# Patient Record
Sex: Female | Born: 1990 | Hispanic: Yes | Smoking: Never smoker
Health system: Southern US, Community
[De-identification: ages and names within clinical notes are randomized; demographics above are authoritative.]

## PROBLEM LIST (undated history)

## (undated) HISTORY — PX: NO PAST SURGERIES: SHX2092

---

## 2011-09-09 ENCOUNTER — Inpatient Hospital Stay (HOSPITAL_COMMUNITY): Admission: AD | Admit: 2011-09-09 | Payer: Self-pay | Source: Ambulatory Visit | Admitting: Obstetrics

## 2012-05-23 LAB — OB RESULTS CONSOLE ABO/RH: RH Type: POSITIVE

## 2012-05-23 LAB — OB RESULTS CONSOLE RPR: RPR: NONREACTIVE

## 2012-05-23 LAB — OB RESULTS CONSOLE HEPATITIS B SURFACE ANTIGEN: Hepatitis B Surface Ag: NEGATIVE

## 2012-08-15 NOTE — L&D Delivery Note (Signed)
Delivery Note At 9:57 PM a viable unspecified sex was delivered via Vaginal, Spontaneous Delivery (Presentation: ;  ).  APGAR: , ; weight .   Placenta status: , .  Cord: 3 vessels with the following complications: .  Cord pH: not done  Anesthesia: Epidural  Episiotomy: None Lacerations: 2nd degree;Perineal Suture Repair: 2.0 vicryl Est. Blood Loss (mL): 250  Mom to postpartum.  Baby to nursery-stable.  Rochele Lueck A 11/06/2012, 10:13 PM

## 2012-11-05 ENCOUNTER — Encounter (HOSPITAL_COMMUNITY): Payer: Self-pay | Admitting: *Deleted

## 2012-11-05 ENCOUNTER — Telehealth (HOSPITAL_COMMUNITY): Payer: Self-pay | Admitting: *Deleted

## 2012-11-05 ENCOUNTER — Inpatient Hospital Stay (HOSPITAL_COMMUNITY)
Admission: AD | Admit: 2012-11-05 | Discharge: 2012-11-05 | Disposition: A | Discharge status: Home / Self Care | Payer: Self-pay | Source: Ambulatory Visit | Attending: Obstetrics | Admitting: Obstetrics

## 2012-11-05 DIAGNOSIS — O479 False labor, unspecified: Secondary | ICD-10-CM | POA: Insufficient documentation

## 2012-11-05 NOTE — Telephone Encounter (Signed)
Preadmission screen  

## 2012-11-05 NOTE — Discharge Instructions (Signed)
Fetal Movement Counts Patient Name: __________________________________________________ Patient Due Date: ____________________ Candice Davis counts is highly recommended in high risk pregnancies, but it is a good idea for every pregnant woman to do. Start counting fetal movements at 28 weeks of the pregnancy. Fetal movements increase after eating a full meal or eating or drinking something sweet (the blood sugar is higher). It is also important to drink plenty of fluids (well hydrated) before doing the count. Lie on your left side because it helps with the circulation or you can sit in a comfortable chair with your arms over your belly (abdomen) with no distractions around you. DOING THE COUNT  Try to do the count the same time of day each time you do it.  Mark the day and time, then see how long it takes for you to feel 10 movements (kicks, flutters, swishes, rolls). You should have at least 10 movements within 2 hours. You will most likely feel 10 movements in much less than 2 hours. If you do not, wait an hour and count again. After a couple of days you will see a pattern.  What you are looking for is a change in the pattern or not enough counts in 2 hours. Is it taking longer in time to reach 10 movements? SEEK MEDICAL CARE IF:  You feel less than 10 counts in 2 hours. Tried twice.  No movement in one hour.  The pattern is changing or taking longer each day to reach 10 counts in 2 hours.  You feel the baby is not moving as it usually does. Date: ____________ Movements: ____________ Start time: ____________ Doreatha Martin time: ____________  Date: ____________ Movements: ____________ Start time: ____________ Doreatha Martin time: ____________ Date: ____________ Movements: ____________ Start time: ____________ Doreatha Martin time: ____________ Date: ____________ Movements: ____________ Start time: ____________ Doreatha Martin time: ____________ Date: ____________ Movements: ____________ Start time: ____________ Doreatha Martin time:  ____________ Date: ____________ Movements: ____________ Start time: ____________ Doreatha Martin time: ____________ Date: ____________ Movements: ____________ Start time: ____________ Doreatha Martin time: ____________ Date: ____________ Movements: ____________ Start time: ____________ Doreatha Martin time: ____________  Date: ____________ Movements: ____________ Start time: ____________ Doreatha Martin time: ____________ Date: ____________ Movements: ____________ Start time: ____________ Doreatha Martin time: ____________ Date: ____________ Movements: ____________ Start time: ____________ Doreatha Martin time: ____________ Date: ____________ Movements: ____________ Start time: ____________ Doreatha Martin time: ____________ Date: ____________ Movements: ____________ Start time: ____________ Doreatha Martin time: ____________ Date: ____________ Movements: ____________ Start time: ____________ Doreatha Martin time: ____________ Date: ____________ Movements: ____________ Start time: ____________ Doreatha Martin time: ____________  Date: ____________ Movements: ____________ Start time: ____________ Doreatha Martin time: ____________ Date: ____________ Movements: ____________ Start time: ____________ Doreatha Martin time: ____________ Date: ____________ Movements: ____________ Start time: ____________ Doreatha Martin time: ____________ Date: ____________ Movements: ____________ Start time: ____________ Doreatha Martin time: ____________ Date: ____________ Movements: ____________ Start time: ____________ Doreatha Martin time: ____________ Date: ____________ Movements: ____________ Start time: ____________ Doreatha Martin time: ____________ Date: ____________ Movements: ____________ Start time: ____________ Doreatha Martin time: ____________  Date: ____________ Movements: ____________ Start time: ____________ Doreatha Martin time: ____________ Date: ____________ Movements: ____________ Start time: ____________ Doreatha Martin time: ____________ Date: ____________ Movements: ____________ Start time: ____________ Doreatha Martin time: ____________ Date: ____________ Movements:  ____________ Start time: ____________ Doreatha Martin time: ____________ Date: ____________ Movements: ____________ Start time: ____________ Doreatha Martin time: ____________ Date: ____________ Movements: ____________ Start time: ____________ Doreatha Martin time: ____________ Date: ____________ Movements: ____________ Start time: ____________ Doreatha Martin time: ____________  Date: ____________ Movements: ____________ Start time: ____________ Doreatha Martin time: ____________ Date: ____________ Movements: ____________ Start time: ____________ Doreatha Martin time: ____________ Date: ____________ Movements: ____________ Start time: ____________ Doreatha Martin time: ____________ Date: ____________ Movements:  ____________ Start time: ____________ Doreatha Martin time: ____________ Date: ____________ Movements: ____________ Start time: ____________ Doreatha Martin time: ____________ Date: ____________ Movements: ____________ Start time: ____________ Doreatha Martin time: ____________ Date: ____________ Movements: ____________ Start time: ____________ Doreatha Martin time: ____________  Date: ____________ Movements: ____________ Start time: ____________ Doreatha Martin time: ____________ Date: ____________ Movements: ____________ Start time: ____________ Doreatha Martin time: ____________ Date: ____________ Movements: ____________ Start time: ____________ Doreatha Martin time: ____________ Date: ____________ Movements: ____________ Start time: ____________ Doreatha Martin time: ____________ Date: ____________ Movements: ____________ Start time: ____________ Doreatha Martin time: ____________ Date: ____________ Movements: ____________ Start time: ____________ Doreatha Martin time: ____________ Date: ____________ Movements: ____________ Start time: ____________ Doreatha Martin time: ____________  Date: ____________ Movements: ____________ Start time: ____________ Doreatha Martin time: ____________ Date: ____________ Movements: ____________ Start time: ____________ Doreatha Martin time: ____________ Date: ____________ Movements: ____________ Start time: ____________ Doreatha Martin  time: ____________ Date: ____________ Movements: ____________ Start time: ____________ Doreatha Martin time: ____________ Date: ____________ Movements: ____________ Start time: ____________ Doreatha Martin time: ____________ Date: ____________ Movements: ____________ Start time: ____________ Doreatha Martin time: ____________ Date: ____________ Movements: ____________ Start time: ____________ Doreatha Martin time: ____________  Date: ____________ Movements: ____________ Start time: ____________ Doreatha Martin time: ____________ Date: ____________ Movements: ____________ Start time: ____________ Doreatha Martin time: ____________ Date: ____________ Movements: ____________ Start time: ____________ Doreatha Martin time: ____________ Date: ____________ Movements: ____________ Start time: ____________ Doreatha Martin time: ____________ Date: ____________ Movements: ____________ Start time: ____________ Doreatha Martin time: ____________ Date: ____________ Movements: ____________ Start time: ____________ Doreatha Martin time: ____________ Document Released: 08/31/2006 Document Revised: 10/24/2011 Document Reviewed: 03/03/2009 ExitCare Patient Information 2013 [employer]. Braxton Hicks Contractions Pregnancy is commonly associated with contractions of the uterus throughout the pregnancy. Towards the end of pregnancy (32 to 34 weeks), these contractions [facility] Willa Rough) can develop more often and may become more forceful. This is not true labor because these contractions do not result in opening (dilatation) and thinning of the cervix. They are sometimes difficult to tell apart from true labor because these contractions can be forceful and people have different pain tolerances. You should not feel embarrassed if you go to the hospital with false labor. Sometimes, the only way to tell if you are in true labor is for your caregiver to follow the changes in the cervix. How to tell the difference between true and false labor:  False labor.  The contractions of false labor are usually shorter,  irregular and not as hard as those of true labor.  They are often felt in the front of the lower abdomen and in the groin.  They may leave with walking around or changing positions while lying down.  They get weaker and are shorter lasting as time goes on.  These contractions are usually irregular.  They do not usually become progressively stronger, regular and closer together as with true labor.  True labor.  Contractions in true labor last 30 to 70 seconds, become very regular, usually become more intense, and increase in frequency.  They do not go away with walking.  The discomfort is usually felt in the top of the uterus and spreads to the lower abdomen and low back.  True labor can be determined by your caregiver with an exam. This will show that the cervix is dilating and getting thinner. If there are no prenatal problems or other health problems associated with the pregnancy, it is completely safe to be sent home with false labor and await the onset of true labor. HOME CARE INSTRUCTIONS   Keep up with your usual exercises and instructions.  Take medications as directed.  Keep your regular prenatal appointment.  Eat and drink lightly if you think you are going into labor.  If BH contractions are making you uncomfortable:  Change your activity position from lying down or resting to walking/walking to resting.  Sit and rest in a tub of warm water.  Drink 2 to 3 glasses of water. Dehydration may cause B-H contractions.  Do slow and deep breathing several times an hour. SEEK IMMEDIATE MEDICAL CARE IF:   Your contractions continue to become stronger, more regular, and closer together.  You have a gushing, burst or leaking of fluid from the vagina.  An oral temperature above 102 F (38.9 C) develops.  You have passage of blood-tinged mucus.  You develop vaginal bleeding.  You develop continuous belly (abdominal) pain.  You have low back pain that you never had  before.  You feel the baby's head pushing down causing pelvic pressure.  The baby is not moving as much as it used to. Document Released: 08/01/2005 Document Revised: 10/24/2011 Document Reviewed: 01/23/2009 [facility] Patient Information 2013 [place], [place]. Contracciones de Designer, multimedia ([employer]) Usted presenta un falso trabajo de [place]. Durante todo el embarazo aparecen con frecuencia contracciones del tero. Hacia el final del embarazo (32-34 semanas) estas contracciones (Braxton Hicks) pueden hacerse ms fuertes. No se trata de un trabajo de parto verdadero porque no producen un agrandamiento (dilatacin) y afinamiento del cuello del tero. Algunas veces resulta difcil distinguirlas del trabajo de parto verdadero porque en algunos casos llegan a ser muy intensas y las personas tienen distinta tolerancia al [employer] No debe sentirse avergonzada si ingresa al hospital con un falso trabajo de [place]. En ocasiones la nica forma de saber si est en un parto verdadero es observar los cambios en el cuello del tero. A veces, la nica forma de saber si realmente est en trabajo de parto es para el mdico observar los cambios en el tero. Como diferenciar el [place] de parto falso del verdadero:  Aggie Cosier de parto falso.  Las contracciones falsas generalmente duran menos y no son tan intensas como las verdaderas.  Generalmente se sienten en la zona inferior del abdomen y en la ingle.  Pueden aliviarse con una caminata o cambiar de posicin mientras se est acostada.  A medida que pasa el tiempo son ms cortas y dbiles.  Generalmente son irregulares.  No se hacen progresivamente ms intensas y Herbalist entre s [employer].  Trabajo de parto verdadero.  Las contracciones verdaderas duran de 30 a 70 segundos, son ms regulares, generalmente se hacen ms intensas y Comptroller.  No desaparecen al caminar.  La molestia generalmente se siente en la parte  superior del tero y se extiende hacia la zona inferior del abdomen y [employer] cintura.  El profesional que la asiste podr examinarla para determinar si el trabajo de parto es verdadero. El examen mostrar si el cuello del tero se est dilatando y [place]. Si no hay problemas prenatales u otras complicaciones de la salud asociadas al embarazo, no habr inconvenientes si la envan a su casa y espera el comienzo del verdadero trabajo de [place]. INSTRUCCIONES PARA EL CUIDADO DOMICILIARIO  Siga con los ejercicios y las indicaciones habituales.  Tome los medicamentos como se le indic.  Cumpla con las citas regularmente.  Coma y beba ligero si cree que dar a luz.  Si se siente incmoda por las contracciones:  Cambie de [place], si est acostada o en reposo, camine y si est caminando, repose.  Sintense y repose en Neomia Dear  baadera con agua caliente.  Beba entre 2 y 3 vasos de [place]. La deshidratacin puede causar contracciones BH.  Respire lenta y profundamente varias veces por hora. SOLICITE ATENCIN MDICA DE INMEDIATO SI:  Las contracciones se intensifican, se hacen ms regulares y [employer] s.  Tiene una prdida importante de lquido de la vagina  La temperatura oral se eleva sin motivo por encima de 102 F (38.9 C) o segn le indique el profesional que la asiste.  Elimina una mucosidad sanguinolenta.  Presenta hemorragia vaginal.  Presenta dolor abdominal constante.  Siente un dolor en la parte baja de la espalda que nunca haba sentido antes.  Siente que el beb empuja hacia abajo y le causa presin plvica.  El beb no se mueve tanto como antes. Document Released: 05/11/2005 Document Revised: 10/24/2011 [facility] Patient Information 2013 [place], [place]. Mt[place] para contar los movimientos fetales (Fetal Movement Counts) Nombre de la paciente: __________________________________________________ Candice Davis probable de parto:____________________ En los embarazos de alto  riesgo se recomienda contar las pataditas, pero tambin es una buena idea que lo hagan todas las [place]. Comience a contarlas a las 28 semanas de embarazo. Los movimientos fetales aumentan luego de una comida Immunologist o de comer o beber algo dulce (el nivel de azcar en la sangre est ms alto). Tambin es importante beber gran cantidad de lquidos (hidratarse bien) antes de contar. Si se recuesta sobre el lado izquierdo mejorar la Designer, industrial/product, o puede sentarse en una silla cmoda con los brazos sobre el abdomen y sin distracciones que la rodeen. CONTANDO  Trate de contar a la [employer] lo haga.  Marque el da y la hora y vea cunto le lleva sentir 10 movimientos (patadas, agitaciones, sacudones, vueltas). Debe sentir al menos 10 movimientos en 2 horas. Probablemente sienta los 10 movimientos en menos de dos horas. Si no los siente, espere una hora y cuente nuevamente. Luego de [employer] tendr un patrn.  Debemos observar si hay cambios en el patrn o no hay suficientes pataditas en 2 horas. Le lleva ms tiempo contar los 10 movimientos? SOLICITE ATENCIN MDICA SI:  Siente menos de 10 pataditas en 2 horas. Intntelo dos veces.  No siente movimientos durante 1 hora.  El patrn se modifica o le lleva ms tiempo Art gallery manager las 10 pataditas.  Siente que el beb no se mueve como lo hace habitualmente. Fecha: ____________ Movimientos: ____________ Comienzo hora: ____________ Cephas Darby: ____________ Candice Davis: ____________ Movimientos: ____________ Comienzo hora: ____________ Cephas Darby: ____________ Candice Davis: ____________ Movimientos: ____________ Comienzo hora: ____________ Cephas Darby: ____________ Candice Davis: ____________ Movimientos: ____________ Comienzo hora: ____________ Cephas Darby: ____________ Candice Davis: ____________ Movimientos: ____________ Comienzo hora: ____________ Cephas Darby: ____________ Candice Davis: ____________ Movimientos: ____________ Comienzo hora: ____________ Cephas Darby:  ____________ Candice Davis: ____________ Movimientos: ____________ Comienzo hora: ____________ Cephas Darby: ____________  Candice Davis: ____________ Movimientos: ____________ Comienzo hora: ____________ Cephas Darby: ____________ Candice Davis: ____________ Movimientos: ____________ Comienzo hora: ____________ Cephas Darby: ____________ Candice Davis: ____________ Movimientos: ____________ Comienzo hora: ____________ Cephas Darby: ____________ Candice Davis: ____________ Movimientos: ____________ Comienzo hora: ____________ Cephas Darby: ____________ Candice Davis: ____________ Movimientos: ____________ Comienzo hora: ____________ Cephas Darby: ____________ Candice Davis: ____________ Movimientos: ____________ Comienzo hora: ____________ Cephas Darby: ____________ Candice Davis: ____________ Movimientos: ____________ Comienzo hora: ____________ Cephas Darby: ____________  Candice Davis: ____________ Movimientos: ____________ Comienzo hora: ____________ Cephas Darby: ____________ Candice Davis: ____________ Movimientos: ____________ Comienzo hora: ____________ Cephas Darby: ____________ Candice Davis: ____________ Movimientos: ____________ Comienzo hora: ____________ Cephas Darby: ____________ Candice Davis: ____________ Movimientos: ____________ Comienzo hora: ____________ Cephas Darby: ____________ Candice Davis: ____________ Movimientos: ____________ Comienzo hora: ____________  Fin hora: ____________ Candice Davis: ____________ Movimientos: ____________ Comienzo hora: ____________ Cephas Darby: ____________ Candice Davis: ____________ Movimientos: ____________ Comienzo hora: ____________ Cephas Darby: ____________  Candice Davis: ____________ Movimientos: ____________ Comienzo hora: ____________ Cephas Darby: ____________ Candice Davis: ____________ Movimientos: ____________ Comienzo hora: ____________ Cephas Darby: ____________ Candice Davis: ____________ Movimientos: ____________ Comienzo hora: ____________ Cephas Darby: ____________ Candice Davis: ____________ Movimientos: ____________ Comienzo hora: ____________ Cephas Darby: ____________ Candice Davis: ____________ Movimientos: ____________ Comienzo hora:  ____________ Cephas Darby: ____________ Candice Davis: ____________ Movimientos: ____________ Comienzo hora: ____________ Cephas Darby: ____________ Candice Davis: ____________ Movimientos: ____________ Comienzo hora: ____________ Cephas Darby: ____________  Candice Davis: ____________ Movimientos: ____________ Comienzo hora: ____________ Cephas Darby: ____________ Candice Davis: ____________ Movimientos: ____________ Comienzo hora: ____________ Cephas Darby: ____________ Candice Davis: ____________ Movimientos: ____________ Comienzo hora: ____________ Cephas Darby: ____________ Candice Davis: ____________ Movimientos: ____________ Comienzo hora: ____________ Cephas Darby: ____________ Candice Davis: ____________ Movimientos: ____________ Comienzo hora: ____________ Cephas Darby: ____________ Candice Davis: ____________ Movimientos: ____________ Comienzo hora: ____________ Cephas Darby: ____________ Candice Davis: ____________ Movimientos: ____________ Comienzo hora: ____________ Cephas Darby: ____________  Candice Davis: ____________ Movimientos: ____________ Comienzo hora: ____________ Cephas Darby: ____________ Candice Davis: ____________ Movimientos: ____________ Comienzo hora: ____________ Cephas Darby: ____________ Candice Davis: ____________ Movimientos: ____________ Comienzo hora: ____________ Cephas Darby: ____________ Candice Davis: ____________ Movimientos: ____________ Comienzo hora: ____________ Cephas Darby: ____________ Candice Davis: ____________ Movimientos: ____________ Comienzo hora: ____________ Cephas Darby: ____________ Candice Davis: ____________ Movimientos: ____________ Comienzo hora: ____________ Cephas Darby: ____________ Candice Davis: ____________ Movimientos: ____________ Comienzo hora: ____________ Cephas Darby: ____________  Candice Davis: ____________ Movimientos: ____________ Comienzo hora: ____________ Cephas Darby: ____________ Candice Davis: ____________ Movimientos: ____________ Comienzo hora: ____________ Cephas Darby: ____________ Candice Davis: ____________ Movimientos: ____________ Comienzo hora: ____________ Cephas Darby: ____________ Candice Davis: ____________ Movimientos: ____________  Comienzo hora: ____________ Cephas Darby: ____________ Candice Davis: ____________ Movimientos: ____________ Comienzo hora: ____________ Cephas Darby: ____________ Candice Davis: ____________ Movimientos: ____________ Comienzo hora: ____________ Cephas Darby: ____________ Candice Davis: ____________ Movimientos: ____________ Comienzo hora: ____________ Cephas Darby: ____________  Candice Davis: ____________ Movimientos: ____________ Comienzo hora: ____________ Cephas Darby: ____________ Candice Davis: ____________ Movimientos: ____________ Comienzo hora: ____________ Cephas Darby: ____________ Candice Davis: ____________ Movimientos: ____________ Comienzo hora: ____________ Cephas Darby: ____________ Candice Davis: ____________ Movimientos: ____________ Comienzo hora: ____________ Cephas Darby: ____________ Candice Davis: ____________ Movimientos: ____________ Comienzo hora: ____________ Cephas Darby: ____________ Candice Davis: ____________ Movimientos: ____________ Comienzo hora: ____________ Cephas Darby: ____________ Candice Davis: ____________ Movimientos: ____________ Comienzo hora: ____________ Fin hora: ____________  Document Released: 11/08/2007 Document Revised: 10/24/2011 ExitCare Patient Information 2013 [place], [place].

## 2012-11-05 NOTE — MAU Note (Signed)
Pt states has been cntracting x 48 hours

## 2012-11-05 NOTE — Telephone Encounter (Signed)
Preadmission screen Interpreter number [phone]

## 2012-11-06 ENCOUNTER — Inpatient Hospital Stay (HOSPITAL_COMMUNITY): Payer: Medicaid Other | Admitting: Anesthesiology

## 2012-11-06 ENCOUNTER — Encounter (HOSPITAL_COMMUNITY): Payer: Self-pay | Admitting: Anesthesiology

## 2012-11-06 ENCOUNTER — Encounter (HOSPITAL_COMMUNITY): Payer: Self-pay | Admitting: *Deleted

## 2012-11-06 ENCOUNTER — Inpatient Hospital Stay (HOSPITAL_COMMUNITY): Payer: Medicaid Other

## 2012-11-06 ENCOUNTER — Inpatient Hospital Stay (HOSPITAL_COMMUNITY)
Admission: AD | Admit: 2012-11-06 | Discharge: 2012-11-08 | DRG: 775 | Disposition: A | Discharge status: Home / Self Care | Payer: Medicaid Other | Source: Ambulatory Visit | Attending: Obstetrics | Admitting: Obstetrics

## 2012-11-06 DIAGNOSIS — O4100X0 Oligohydramnios, unspecified trimester, not applicable or unspecified: Principal | ICD-10-CM | POA: Diagnosis present

## 2012-11-06 LAB — CBC
MCV: 86.1 fL (ref 78.0–100.0)
Platelets: 241 10*3/uL (ref 150–400)
RBC: 4.4 MIL/uL (ref 3.87–5.11)
WBC: 17.3 10*3/uL — ABNORMAL HIGH (ref 4.0–10.5)

## 2012-11-06 LAB — TYPE AND SCREEN
ABO/RH(D): O POS
Antibody Screen: NEGATIVE

## 2012-11-06 LAB — RPR: RPR Ser Ql: NONREACTIVE

## 2012-11-06 MED ORDER — BUTORPHANOL TARTRATE 1 MG/ML IJ SOLN: 1.0000 mg | INTRAMUSCULAR | Status: DC | PRN

## 2012-11-06 MED ORDER — OXYTOCIN BOLUS FROM INFUSION
500.0000 mL | INTRAVENOUS | Status: DC
  Administered 2012-11-06: 500 mL via INTRAVENOUS

## 2012-11-06 MED ORDER — PHENYLEPHRINE 40 MCG/ML (10ML) SYRINGE FOR IV PUSH (FOR BLOOD PRESSURE SUPPORT)
80.0000 ug | PREFILLED_SYRINGE | INTRAVENOUS | Status: DC | PRN
  Filled 2012-11-06: qty 5

## 2012-11-06 MED ORDER — OXYCODONE-ACETAMINOPHEN 5-325 MG PO TABS: 1.0000 | ORAL_TABLET | ORAL | Status: DC | PRN

## 2012-11-06 MED ORDER — OXYTOCIN 40 UNITS IN LACTATED RINGERS INFUSION - SIMPLE MED
62.5000 mL/h | INTRAVENOUS | Status: DC
  Filled 2012-11-06: qty 1000

## 2012-11-06 MED ORDER — TERBUTALINE SULFATE 1 MG/ML IJ SOLN: 0.2500 mg | Freq: Once | INTRAMUSCULAR | Status: AC | PRN

## 2012-11-06 MED ORDER — IBUPROFEN 600 MG PO TABS
600.0000 mg | ORAL_TABLET | Freq: Four times a day (QID) | ORAL | Status: DC | PRN
  Administered 2012-11-08: 600 mg via ORAL
  Filled 2012-11-06 (×6): qty 1

## 2012-11-06 MED ORDER — ACETAMINOPHEN 325 MG PO TABS
650.0000 mg | ORAL_TABLET | ORAL | Status: DC | PRN
  Administered 2012-11-06: 650 mg via ORAL
  Filled 2012-11-06: qty 2

## 2012-11-06 MED ORDER — FENTANYL 2.5 MCG/ML BUPIVACAINE 1/10 % EPIDURAL INFUSION (WH - ANES)
14.0000 mL/h | INTRAMUSCULAR | Status: DC | PRN
  Administered 2012-11-06: 14 mL/h via EPIDURAL
  Administered 2012-11-06: 12 mL/h via EPIDURAL
  Filled 2012-11-06 (×2): qty 125

## 2012-11-06 MED ORDER — CITRIC ACID-SODIUM CITRATE 334-500 MG/5ML PO SOLN: 30.0000 mL | ORAL | Status: DC | PRN

## 2012-11-06 MED ORDER — METHYLERGONOVINE MALEATE 0.2 MG/ML IJ SOLN
INTRAMUSCULAR | Status: AC
  Administered 2012-11-06: 0.2 mg
  Filled 2012-11-06: qty 1

## 2012-11-06 MED ORDER — EPHEDRINE 5 MG/ML INJ
10.0000 mg | INTRAVENOUS | Status: DC | PRN
  Filled 2012-11-06: qty 4

## 2012-11-06 MED ORDER — DIPHENHYDRAMINE HCL 50 MG/ML IJ SOLN: 12.5000 mg | INTRAMUSCULAR | Status: DC | PRN

## 2012-11-06 MED ORDER — EPHEDRINE 5 MG/ML INJ: 10.0000 mg | INTRAVENOUS | Status: DC | PRN

## 2012-11-06 MED ORDER — PHENYLEPHRINE 40 MCG/ML (10ML) SYRINGE FOR IV PUSH (FOR BLOOD PRESSURE SUPPORT): 80.0000 ug | PREFILLED_SYRINGE | INTRAVENOUS | Status: DC | PRN

## 2012-11-06 MED ORDER — SODIUM CHLORIDE 0.9 % IV SOLN
2.0000 g | Freq: Four times a day (QID) | INTRAVENOUS | Status: DC
  Administered 2012-11-06: 2 g via INTRAVENOUS
  Filled 2012-11-06 (×4): qty 2000

## 2012-11-06 MED ORDER — OXYTOCIN 40 UNITS IN LACTATED RINGERS INFUSION - SIMPLE MED
1.0000 m[IU]/min | INTRAVENOUS | Status: DC
  Administered 2012-11-06: 2 m[IU]/min via INTRAVENOUS

## 2012-11-06 MED ORDER — LACTATED RINGERS IV SOLN
INTRAVENOUS | Status: DC
  Administered 2012-11-06 (×4): via INTRAVENOUS

## 2012-11-06 MED ORDER — LACTATED RINGERS IV SOLN
500.0000 mL | INTRAVENOUS | Status: DC | PRN
  Administered 2012-11-06: 1000 mL via INTRAVENOUS

## 2012-11-06 MED ORDER — LACTATED RINGERS IV SOLN: 500.0000 mL | Freq: Once | INTRAVENOUS | Status: DC

## 2012-11-06 MED ORDER — LIDOCAINE HCL (PF) 1 % IJ SOLN
30.0000 mL | INTRAMUSCULAR | Status: DC | PRN
  Administered 2012-11-06: 30 mL via SUBCUTANEOUS
  Filled 2012-11-06: qty 30

## 2012-11-06 MED ORDER — ONDANSETRON HCL 4 MG/2ML IJ SOLN: 4.0000 mg | Freq: Four times a day (QID) | INTRAMUSCULAR | Status: DC | PRN

## 2012-11-06 MED ORDER — LIDOCAINE HCL (PF) 1 % IJ SOLN
INTRAMUSCULAR | Status: DC | PRN
  Administered 2012-11-06 (×3): 4 mL

## 2012-11-06 MED ORDER — FLEET ENEMA 7-19 GM/118ML RE ENEM: 1.0000 | ENEMA | RECTAL | Status: DC | PRN

## 2012-11-06 NOTE — Anesthesia Procedure Notes (Signed)
Epidural Patient location during procedure: OB Start time: 11/06/2012 10:50 AM  Staffing Performed by: anesthesiologist   Preanesthetic Checklist Completed: patient identified, site marked, surgical consent, pre-op evaluation, timeout performed, IV checked, risks and benefits discussed and monitors and equipment checked  Epidural Patient position: sitting Prep: site prepped and draped and DuraPrep Patient monitoring: continuous pulse ox and blood pressure Approach: midline Injection technique: LOR air  Needle:  Needle type: Tuohy  Needle gauge: 17 G Needle length: 9 cm and 9 Needle insertion depth: 5 cm cm Catheter type: closed end flexible Catheter size: 19 Gauge Catheter at skin depth: 10 cm Test dose: negative  Assessment Events: blood not aspirated, injection not painful, no injection resistance, negative IV test and no paresthesia  Additional Notes Discussed risk of headache, infection, bleeding, nerve injury and failed or incomplete block.  Patient voices understanding and wishes to proceed.  Spanish interpreter present.  Epidural placed easily on first attempt.  No paresthesia.  Patient tolerated procedure well with no apparent complications.  Jasmine December, MDReason for block:procedure for pain

## 2012-11-06 NOTE — Progress Notes (Signed)
Interpreter at bedside to update pt on progress and give her an opportunity to ask questions.

## 2012-11-06 NOTE — Anesthesia Preprocedure Evaluation (Signed)

## 2012-11-06 NOTE — H&P (Signed)
This is Dr. Francoise Ceo dictating the history and physical on  Candice Davis she's a 22 year old gravida 1 at 40 weeks and 6 days her EDC is 10/30/2012 she's brought in for induction having irregular contractions negative GBS cervix 1 cm 90% vertex -2-3 amniotomy performed fluid clear the patient had a biophysical profile this a.m. And was found to have oligohydramnios AFI of 5 she is started on low-dose Pitocin GBS negative Past medical history negative Past surgical history negative Social history noncontributory Physical exam well-developed female in no distress HEENT negative Breasts negative Heart regular rhythm no murmurs no gallops Lungs clear to P&A Abdomen term Pelvic as described above Extremities negative and

## 2012-11-06 NOTE — MAU Note (Signed)
contractions 

## 2012-11-07 ENCOUNTER — Inpatient Hospital Stay (HOSPITAL_COMMUNITY): Admission: RE | Admit: 2012-11-07 | Payer: Self-pay | Source: Ambulatory Visit

## 2012-11-07 LAB — CBC
MCH: 29.1 pg (ref 26.0–34.0)
MCHC: 33.8 g/dL (ref 30.0–36.0)
RDW: 15 % (ref 11.5–15.5)

## 2012-11-07 MED ORDER — DIPHENHYDRAMINE HCL 25 MG PO CAPS: 25.0000 mg | ORAL_CAPSULE | Freq: Four times a day (QID) | ORAL | Status: DC | PRN

## 2012-11-07 MED ORDER — IBUPROFEN 600 MG PO TABS
600.0000 mg | ORAL_TABLET | Freq: Four times a day (QID) | ORAL | Status: DC
  Administered 2012-11-07 – 2012-11-08 (×5): 600 mg via ORAL

## 2012-11-07 MED ORDER — FERROUS SULFATE 325 (65 FE) MG PO TABS
325.0000 mg | ORAL_TABLET | Freq: Two times a day (BID) | ORAL | Status: DC
  Administered 2012-11-07 – 2012-11-08 (×3): 325 mg via ORAL
  Filled 2012-11-07 (×4): qty 1

## 2012-11-07 MED ORDER — SIMETHICONE 80 MG PO CHEW: 80.0000 mg | CHEWABLE_TABLET | ORAL | Status: DC | PRN

## 2012-11-07 MED ORDER — OXYCODONE-ACETAMINOPHEN 5-325 MG PO TABS: 1.0000 | ORAL_TABLET | ORAL | Status: DC | PRN

## 2012-11-07 MED ORDER — SENNOSIDES-DOCUSATE SODIUM 8.6-50 MG PO TABS
2.0000 | ORAL_TABLET | Freq: Every day | ORAL | Status: DC
  Administered 2012-11-07: 2 via ORAL

## 2012-11-07 MED ORDER — PRENATAL MULTIVITAMIN CH
1.0000 | ORAL_TABLET | Freq: Every day | ORAL | Status: DC
  Administered 2012-11-07 – 2012-11-08 (×2): 1 via ORAL
  Filled 2012-11-07 (×2): qty 1

## 2012-11-07 MED ORDER — WITCH HAZEL-GLYCERIN EX PADS: 1.0000 "application " | MEDICATED_PAD | CUTANEOUS | Status: DC | PRN

## 2012-11-07 MED ORDER — ONDANSETRON HCL 4 MG/2ML IJ SOLN: 4.0000 mg | INTRAMUSCULAR | Status: DC | PRN

## 2012-11-07 MED ORDER — ZOLPIDEM TARTRATE 5 MG PO TABS: 5.0000 mg | ORAL_TABLET | Freq: Every evening | ORAL | Status: DC | PRN

## 2012-11-07 MED ORDER — DIBUCAINE 1 % RE OINT
1.0000 "application " | TOPICAL_OINTMENT | RECTAL | Status: DC | PRN
  Filled 2012-11-07: qty 28

## 2012-11-07 MED ORDER — TETANUS-DIPHTH-ACELL PERTUSSIS 5-2.5-18.5 LF-MCG/0.5 IM SUSP
0.5000 mL | Freq: Once | INTRAMUSCULAR | Status: AC
  Administered 2012-11-07: 0.5 mL via INTRAMUSCULAR
  Filled 2012-11-07 (×2): qty 0.5

## 2012-11-07 MED ORDER — BENZOCAINE-MENTHOL 20-0.5 % EX AERO
1.0000 "application " | INHALATION_SPRAY | CUTANEOUS | Status: DC | PRN
  Filled 2012-11-07: qty 56

## 2012-11-07 MED ORDER — LANOLIN HYDROUS EX OINT: TOPICAL_OINTMENT | CUTANEOUS | Status: DC | PRN

## 2012-11-07 MED ORDER — ONDANSETRON HCL 4 MG PO TABS: 4.0000 mg | ORAL_TABLET | ORAL | Status: DC | PRN

## 2012-11-07 NOTE — Plan of Care (Signed)
Problem: Consults Goal: Postpartum Patient Education (See Patient Education module for education specifics.) Outcome: Completed/Met Date Met:  11/07/12 Interpreter Sharolyn Douglas interpreting

## 2012-11-07 NOTE — Progress Notes (Signed)
UR CHART REVIEW COMPLETED.  

## 2012-11-07 NOTE — Progress Notes (Signed)
Patient ID: Candice Davis, female   DOB: May 29, 1991, 22 y.o.   MRN: [id number] Postpartum day one Vital signs normal Fundus firm Lochia moderate Legs negative No complaints

## 2012-11-07 NOTE — Anesthesia Postprocedure Evaluation (Signed)
  Anesthesia Post-op Note  Patient: Candice Davis  Procedure(s) Performed: * No procedures listed *  Patient Location: Mother/Baby  Anesthesia Type:Epidural  Level of Consciousness: awake, alert , oriented and patient cooperative  Airway and Oxygen Therapy: Patient Spontanous Breathing  Post-op Pain: none  Post-op Assessment: Post-op Vital signs reviewed, Patient's Cardiovascular Status Stable and Respiratory Function Stable  Post-op Vital Signs: Reviewed and stable  Complications: No apparent anesthesia complications

## 2012-11-08 NOTE — Discharge Summary (Signed)
Obstetric Discharge Summary Reason for Admission: induction of labor Prenatal Procedures: none Intrapartum Procedures: spontaneous vaginal delivery Postpartum Procedures: none Complications-Operative and Postpartum: none Hemoglobin  Date Value Range Status  11/07/2012 11.9* 12.0 - 15.0 g/dL Final     HCT  Date Value Range Status  11/07/2012 35.2* 36.0 - 46.0 % Final    Physical Exam:  General: alert Lochia: appropriate Uterine Fundus: firm Incision: healing well DVT Evaluation: No evidence of DVT seen on physical exam.  Discharge Diagnoses: Term Pregnancy-delivered  Discharge Information: Date: 11/08/2012 Activity: pelvic rest Diet: routine Medications: Percocet Condition: stable Instructions: refer to practice specific booklet Discharge to: home Follow-up Information   Follow up with Nikeya Maxim A, MD. Schedule an appointment as soon as possible for a visit in 6 weeks.   Contact information:   [address] [place] [address] [phone]       Newborn Data: Live born female  Birth Weight: 6 lb 12.1 oz (3065 g) APGAR: 9, 9  Home with mother.  Marylouise Mallet A 11/08/2012, 6:56 AM

## 2012-11-08 NOTE — Progress Notes (Signed)
Patient ID: Candice Davis, female   DOB: [birth year], 22 y.o.   MRN: [id number] [address] partum 2vs nl  No problem home today see me in 6 weeks

## 2012-12-18 LAB — PROCEDURE REPORT - SCANNED: Pap: NEGATIVE

## 2014-06-16 ENCOUNTER — Encounter (HOSPITAL_COMMUNITY): Payer: Self-pay | Admitting: *Deleted

## 2018-01-17 ENCOUNTER — Other Ambulatory Visit: Payer: Self-pay

## 2018-01-17 ENCOUNTER — Encounter (HOSPITAL_COMMUNITY): Payer: Self-pay | Admitting: Emergency Medicine

## 2018-01-17 ENCOUNTER — Emergency Department (HOSPITAL_COMMUNITY)
Admission: EM | Admit: 2018-01-17 | Discharge: 2018-01-17 | Disposition: A | Discharge status: Home / Self Care | Payer: Self-pay | Attending: Emergency Medicine | Admitting: Emergency Medicine

## 2018-01-17 DIAGNOSIS — J028 Acute pharyngitis due to other specified organisms: Secondary | ICD-10-CM | POA: Insufficient documentation

## 2018-01-17 DIAGNOSIS — B9689 Other specified bacterial agents as the cause of diseases classified elsewhere: Secondary | ICD-10-CM | POA: Insufficient documentation

## 2018-01-17 DIAGNOSIS — R590 Localized enlarged lymph nodes: Secondary | ICD-10-CM | POA: Insufficient documentation

## 2018-01-17 LAB — GROUP A STREP BY PCR: Group A Strep by PCR: NOT DETECTED

## 2018-01-17 MED ORDER — NAPROXEN 375 MG PO TABS: 375.0000 mg | ORAL_TABLET | Freq: Two times a day (BID) | ORAL | 0 refills | Status: DC

## 2018-01-17 MED ORDER — PENICILLIN V POTASSIUM 500 MG PO TABS: 500.0000 mg | ORAL_TABLET | Freq: Four times a day (QID) | ORAL | 0 refills | Status: AC

## 2018-01-17 MED ORDER — BENZOCAINE-MENTHOL 6-10 MG MT LOZG: 1.0000 | LOZENGE | OROMUCOSAL | 0 refills | Status: DC | PRN

## 2018-01-17 MED ORDER — HYDROCODONE-ACETAMINOPHEN 5-325 MG PO TABS
1.0000 | ORAL_TABLET | Freq: Once | ORAL | Status: AC
  Administered 2018-01-17: 1 via ORAL
  Filled 2018-01-17: qty 1

## 2018-01-17 MED ORDER — DEXAMETHASONE 4 MG PO TABS
10.0000 mg | ORAL_TABLET | Freq: Once | ORAL | Status: AC
  Administered 2018-01-17: 10 mg via ORAL
  Filled 2018-01-17: qty 2

## 2018-01-17 NOTE — ED Provider Notes (Signed)
[facility]-EMERGENCY DEPT Provider Note   CSN: [id number] Arrival date & time: 01/17/18  1156     History   Chief Complaint Chief Complaint  Patient presents with  . Sore Throat    HPI Candice Davis is a 27 y.o. female who presents to the ED with sore throat, fever, gland swelling and body aches that started yesterday and have gotten worse.   HPI  History reviewed. No pertinent past medical history.  There are no active problems to display for this patient.   History reviewed. No pertinent surgical history.   OB History    Gravida  1   Para  1   Term  1   Preterm      AB      Living  1     SAB      TAB      Ectopic      Multiple      Live Births  1            Home Medications    Prior to Admission medications   Medication Sig Start Date End Date Taking? Authorizing Provider  benzocaine-menthol (CHLORASEPTIC) 6-10 MG lozenge Take 1 lozenge by mouth as needed for sore throat. 01/17/18   Janne Napoleon, NP  naproxen (NAPROSYN) 375 MG tablet Take 1 tablet (375 mg total) by mouth 2 (two) times daily. 01/17/18   Janne Napoleon, NP  penicillin v potassium (VEETID) 500 MG tablet Take 1 tablet (500 mg total) by mouth 4 (four) times daily for 10 days. 01/17/18 01/27/18  Janne Napoleon, NP    Family History Family History  Problem Relation Age of Onset  . Hypertension Mother   . Diabetes Sister   . Hypertension Maternal Grandmother   . Diabetes Maternal Grandmother   . Hyperlipidemia Maternal Grandmother     Social History Social History   Tobacco Use  . Smoking status: Never Smoker  . Smokeless tobacco: Never Used  Substance Use Topics  . Alcohol use: No  . Drug use: No     Allergies   Patient has no known allergies.   Review of Systems Review of Systems  Constitutional: Positive for chills and fever.  HENT: Positive for sore throat. Negative for trouble swallowing.   Musculoskeletal: Positive for myalgias.    Hematological: Positive for adenopathy.  All other systems reviewed and are negative.    Physical Exam Updated Vital Signs BP 121/77 (BP Location: Left Arm)   Pulse (!) 115   Temp 99.8 F (37.7 C) (Oral)   Resp 20   SpO2 99%   Physical Exam  Constitutional: She appears well-developed and well-nourished. No distress.  HENT:  Head: Normocephalic and atraumatic.  Right Ear: Tympanic membrane normal.  Left Ear: Tympanic membrane normal.  Mouth/Throat: Mucous membranes are normal. Posterior oropharyngeal erythema present. No posterior oropharyngeal edema or tonsillar abscesses.  Eyes: EOM are normal.  Neck: Neck supple.  Cardiovascular: Normal rate and regular rhythm.  Pulmonary/Chest: Effort normal and breath sounds normal.  Abdominal: Soft. There is no tenderness.  Musculoskeletal: Normal range of motion.  Lymphadenopathy:    She has cervical adenopathy.  Neurological: She is alert.  Skin: Skin is warm and dry.  Psychiatric: She has a normal mood and affect.  Nursing note and vitals reviewed.    ED Treatments / Results  Labs (all labs ordered are listed, but only abnormal results are displayed) Labs Reviewed  GROUP A STREP BY PCR  Radiology  No results found.  Procedures Procedures (including critical care time)  Medications Ordered in ED Medications  HYDROcodone-acetaminophen (NORCO/VICODIN) 5-325 MG per tablet 1 tablet (has no administration in time range)     Initial Impression / Assessment and Plan / ED Course  I have reviewed the triage vital signs and the nursing notes. Pt febrile with tonsillar exudate, cervical lymphadenopathy, & dysphagia; diagnosis of bacterial pharyngitis. Treated in the ED with steroids pain medication and Rx PCN.  Pt appears mildly dehydrated, discussed importance of water rehydration. Presentation non concerning for PTA or RPA. No trismus or uvula deviation. Specific return precautions discussed. Pt able to drink water in ED without  difficulty with intact air way. Recommended PCP follow up.   Final Clinical Impressions(s) / ED Diagnoses   Final diagnoses:  Acute bacterial pharyngitis    ED Discharge Orders        Ordered    penicillin v potassium (VEETID) 500 MG tablet  4 times daily     01/17/18 1404    benzocaine-menthol (CHLORASEPTIC) 6-10 MG lozenge  As needed     01/17/18 1404    naproxen (NAPROSYN) 375 MG tablet  2 times daily     01/17/18 1404       Damian Leavelleese, [place] Grande[place] M, NP 01/17/18 1408    Tegeler, Canary Brimhristopher J, MD 01/17/18 [phone]

## 2018-01-17 NOTE — ED Triage Notes (Addendum)
Patient here from home with complaints of sore throat, generalized body aches, fever, chills that started on Monday night.  Denies nausea, vomiting. Tylenol, ibuprofen with no relief.

## 2018-01-17 NOTE — ED Notes (Signed)
Bed: WTR9 Expected date:  Expected time:  Means of arrival:  Comments: 

## 2018-08-06 ENCOUNTER — Other Ambulatory Visit: Payer: Self-pay

## 2018-08-06 ENCOUNTER — Emergency Department (HOSPITAL_COMMUNITY)
Admission: EM | Admit: 2018-08-06 | Discharge: 2018-08-06 | Disposition: A | Discharge status: Home / Self Care | Payer: Self-pay | Attending: Emergency Medicine | Admitting: Emergency Medicine

## 2018-08-06 ENCOUNTER — Encounter (HOSPITAL_COMMUNITY): Payer: Self-pay

## 2018-08-06 ENCOUNTER — Emergency Department (HOSPITAL_COMMUNITY): Payer: Self-pay

## 2018-08-06 DIAGNOSIS — Z79899 Other long term (current) drug therapy: Secondary | ICD-10-CM | POA: Insufficient documentation

## 2018-08-06 DIAGNOSIS — R7612 Nonspecific reaction to cell mediated immunity measurement of gamma interferon antigen response without active tuberculosis: Secondary | ICD-10-CM

## 2018-08-06 DIAGNOSIS — Z3A01 Less than 8 weeks gestation of pregnancy: Secondary | ICD-10-CM | POA: Insufficient documentation

## 2018-08-06 DIAGNOSIS — O9989 Other specified diseases and conditions complicating pregnancy, childbirth and the puerperium: Secondary | ICD-10-CM | POA: Insufficient documentation

## 2018-08-06 DIAGNOSIS — R102 Pelvic and perineal pain: Secondary | ICD-10-CM | POA: Insufficient documentation

## 2018-08-06 DIAGNOSIS — O26899 Other specified pregnancy related conditions, unspecified trimester: Secondary | ICD-10-CM

## 2018-08-06 LAB — COMPREHENSIVE METABOLIC PANEL
ALBUMIN: 3.9 g/dL (ref 3.5–5.0)
ALK PHOS: 54 U/L (ref 38–126)
ALT: 37 U/L (ref 0–44)
ANION GAP: 4 — AB (ref 5–15)
AST: 27 U/L (ref 15–41)
BUN: 7 mg/dL (ref 6–20)
CALCIUM: 8.6 mg/dL — AB (ref 8.9–10.3)
CHLORIDE: 107 mmol/L (ref 98–111)
CO2: 24 mmol/L (ref 22–32)
Creatinine, Ser: 0.55 mg/dL (ref 0.44–1.00)
GFR calc Af Amer: >60 mL/min (ref >60)
GFR calc non Af Amer: >60 mL/min (ref >60)
GLUCOSE: 96 mg/dL (ref 70–99)
Potassium: 3.5 mmol/L (ref 3.5–5.1)
Sodium: 135 mmol/L (ref 135–145)
Total Bilirubin: 0.5 mg/dL (ref 0.3–1.2)
Total Protein: 6.9 g/dL (ref 6.5–8.1)

## 2018-08-06 LAB — WET PREP, GENITAL
CLUE CELLS WET PREP: NONE SEEN
Sperm: NONE SEEN
TRICH WET PREP: NONE SEEN
YEAST WET PREP: NONE SEEN

## 2018-08-06 LAB — CBC
HEMATOCRIT: 40.7 % (ref 36.0–46.0)
HEMOGLOBIN: 13.3 g/dL (ref 12.0–15.0)
MCH: 28.7 pg (ref 26.0–34.0)
MCHC: 32.7 g/dL (ref 30.0–36.0)
MCV: 87.9 fL (ref 80.0–100.0)
NRBC: 0 % (ref 0.0–0.2)
PLATELETS: 297 10*3/uL (ref 150–400)
RBC: 4.63 MIL/uL (ref 3.87–5.11)
RDW: 13.5 % (ref 11.5–15.5)
WBC: 9.6 10*3/uL (ref 4.0–10.5)

## 2018-08-06 LAB — URINALYSIS, ROUTINE W REFLEX MICROSCOPIC
Bilirubin Urine: NEGATIVE
Glucose, UA: NEGATIVE mg/dL
Hgb urine dipstick: NEGATIVE
KETONES UR: 5 mg/dL — AB
Nitrite: NEGATIVE
PROTEIN: NEGATIVE mg/dL
Specific Gravity, Urine: 1.006 (ref 1.005–1.030)
pH: 6 (ref 5.0–8.0)

## 2018-08-06 LAB — I-STAT BETA HCG BLOOD, ED (MC, WL, AP ONLY): I-stat hCG, quantitative: >2000 m[IU]/mL — ABNORMAL HIGH (ref <5)

## 2018-08-06 LAB — LIPASE, BLOOD: LIPASE: 32 U/L (ref 11–51)

## 2018-08-06 LAB — HCG, QUANTITATIVE, PREGNANCY: HCG, BETA CHAIN, QUANT, S: 24606 m[IU]/mL — AB (ref <5)

## 2018-08-06 MED ORDER — SODIUM CHLORIDE 0.9 % IV BOLUS
500.0000 mL | Freq: Once | INTRAVENOUS | Status: AC
  Administered 2018-08-06: 500 mL via INTRAVENOUS

## 2018-08-06 NOTE — ED Provider Notes (Signed)
[facility]-EMERGENCY DEPT Provider Note   CSN: [id number] Arrival date & time: 08/06/18  1437     History   Chief Complaint Chief Complaint  Patient presents with  . Abdominal Pain  . [redacted] weeks pregnant    HPI Earley FavorYesenia Navarro-Torres is a 27 y.o. female who is G2P1 who presents to the ED with complaints of abdominal pain for the past 1 week. Patient states pain started as pelvic/lower abdominal pain bilaterally, but has seem to localize to the L pelvic area. She states pain is intermittent, occurs multiple times per day, and has been progressively worsening. Sharp in nature. No specific alleviating/aggrating factors.  No intervention prior to arrival.  Patient has had some nausea without vomiting.  Does report some loose stools.  She has had positive pregnancy test at home, she is taking prenatal vitamin, has not seen ob for this pregnancy, no US to confirm yet. Denies vaginal bleeding, vaginal discharge, dysuria, vomiting, or fevers.   HPI  History reviewed. No pertinent past medical history.  There are no active problems to display for this patient.   History reviewed. No pertinent surgical history.   OB History    Gravida  1   Para  1   Term  1   Preterm      AB      Living  1     SAB      TAB      Ectopic      Multiple      Live Births  1            Home Medications    Prior to Admission medications   Medication Sig Start Date End Date Taking? Authorizing Provider  benzocaine-menthol (CHLORASEPTIC) 6-10 MG lozenge Take 1 lozenge by mouth as needed for sore throat. 01/17/18   Janne NapoleonNeese, Hope M, NP  naproxen (NAPROSYN) 375 MG tablet Take 1 tablet (375 mg total) by mouth 2 (two) times daily. 01/17/18   Janne NapoleonNeese, Hope M, NP    Family History Family History  Problem Relation Age of Onset  . Hypertension Mother   . Diabetes Sister   . Hypertension Maternal Grandmother   . Diabetes Maternal Grandmother   . Hyperlipidemia Maternal  Grandmother     Social History Social History   Tobacco Use  . Smoking status: Never Smoker  . Smokeless tobacco: Never Used  Substance Use Topics  . Alcohol use: No  . Drug use: No     Allergies   Patient has no known allergies.   Review of Systems Review of Systems  Constitutional: Negative for chills and fever.  Gastrointestinal: Positive for abdominal pain and nausea. Negative for blood in stool, constipation, diarrhea, rectal pain and vomiting.  Genitourinary: Positive for pelvic pain. Negative for dysuria, urgency, vaginal bleeding and vaginal discharge.  All other systems reviewed and are negative.  Physical Exam Updated Vital Signs BP 108/60   Pulse 64   Temp 98.1 F (36.7 C) (Oral)   Resp 16   Ht 4\' 9"  (1.448 m)   Wt 66.2 kg   SpO2 100%   BMI 31.59 kg/m   Physical Exam Vitals signs and nursing note reviewed. Exam conducted with a chaperone present.  Constitutional:      General: She is not in acute distress.    Appearance: She is well-developed. She is not toxic-appearing.  HENT:     Head: Normocephalic and atraumatic.  Eyes:     General:  Right eye: No discharge.        Left eye: No discharge.     Conjunctiva/sclera: Conjunctivae normal.  Neck:     Musculoskeletal: Neck supple.  Cardiovascular:     Rate and Rhythm: Normal rate and regular rhythm.  Pulmonary:     Effort: Pulmonary effort is normal. No respiratory distress.     Breath sounds: Normal breath sounds. No wheezing, rhonchi or rales.  Abdominal:     General: There is no distension.     Palpations: Abdomen is soft.     Tenderness: There is abdominal tenderness (pelvic tenderness, more so to the L pelvic area) in the suprapubic area. There is no guarding or rebound.  Genitourinary:    Exam position: Supine.     Pubic Area: No rash.      Labia:        Right: No rash or lesion.        Left: No rash or lesion.      Cervix: No cervical motion tenderness, discharge, friability,  erythema or cervical bleeding.     Adnexa:        Right: No mass.         Left: No mass.       Comments: Patient w/ bilateral adnexal tenderness, L >R Skin:    General: Skin is warm and dry.     Findings: No rash.  Neurological:     Mental Status: She is alert.     Comments: Clear speech.   Psychiatric:        Behavior: Behavior normal.      ED Treatments / Results  Labs (all labs ordered are listed, but only abnormal results are displayed) Labs Reviewed  WET PREP, GENITAL - Abnormal; Notable for the following components:      Result Value   WBC, Wet Prep HPF POC RARE (*)    All other components within normal limits  COMPREHENSIVE METABOLIC PANEL - Abnormal; Notable for the following components:   Calcium 8.6 (*)    Anion gap 4 (*)    All other components within normal limits  URINALYSIS, ROUTINE W REFLEX MICROSCOPIC - Abnormal; Notable for the following components:   Ketones, ur 5 (*)    Leukocytes, UA SMALL (*)    Bacteria, UA RARE (*)    All other components within normal limits  HCG, QUANTITATIVE, PREGNANCY - Abnormal; Notable for the following components:   hCG, Beta Chain, Quant, S 24,606 (*)    All other components within normal limits  I-STAT BETA HCG BLOOD, ED ([facility], [facility], [facility] ONLY) - Abnormal; Notable for the following components:   I-stat hCG, quantitative >2,000.0 (*)    All other components within normal limits  URINE CULTURE  LIPASE, BLOOD  CBC  GC/CHLAMYDIA PROBE AMP ([place]) NOT AT [facility]    EKG None  Radiology [place] Ob Comp < 14 Wks  Result Date: 08/06/2018 CLINICAL DATA:  Pelvic pain EXAM: OBSTETRIC <14 WK US AND TRANSVAGINAL OB US TECHNIQUE: Both transabdominal and transvaginal ultrasound examinations were performed for complete evaluation of the gestation as well as the maternal uterus, adnexal regions, and pelvic cul-de-sac. Transvaginal technique was performed to assess early pregnancy. COMPARISON:  None. FINDINGS: Intrauterine gestational sac:  Single Yolk sac:  Visualized. Embryo:  Visualized. Cardiac Activity: Visualized. Heart Rate: 113 bpm CRL:  3 mm   6 w   0 d  [place] EDC: 03/14/2019 Subchorionic hemorrhage:  None visualized. Maternal uterus/adnexae: Subtle circumscribed isoechoic 19 x 18 x 16 mm lesion of the right ovary with subtle posterior acoustic enhancement and with only peripheral vascularity may represent a collapsing corpus luteum. Fibroma is also possibility among some differential considerations though not exclusive. Suggest attention on follow-up. IMPRESSION: Viable 6 week 0 day intrauterine gestation without complicating features. Indeterminate well-circumscribed isoechoic lesion of the right ovary measuring 19 x 18 x 16 mm with subtle posterior acoustic enhancement and no internal vascularity. Findings may represent an involuting corpus luteum possibly a fibroma. Attention on follow up is suggested. Electronically Signed   By: Tollie Eth M.D.   On: 08/06/2018 21:38   US Ob Less Than 14 Weeks With Ob Transvaginal  Result Date: 08/06/2018 CLINICAL DATA:  Pelvic pain EXAM: OBSTETRIC <14 WK [place] AND TRANSVAGINAL OB US TECHNIQUE: Both transabdominal and transvaginal ultrasound examinations were performed for complete evaluation of the gestation as well as the maternal uterus, adnexal regions, and pelvic cul-de-sac. Transvaginal technique was performed to assess early pregnancy. COMPARISON:  None. FINDINGS: Intrauterine gestational sac: Single Yolk sac:  Visualized. Embryo:  Visualized. Cardiac Activity: Visualized. Heart Rate: 113 bpm CRL:  3 mm   6 w   0 d                  [place] EDC: 03/14/2019 Subchorionic hemorrhage:  None visualized. Maternal uterus/adnexae: Subtle circumscribed isoechoic 19 x 18 x 16 mm lesion of the right ovary with subtle posterior acoustic enhancement and with only peripheral vascularity may represent a collapsing corpus luteum. Fibroma is also possibility among some differential considerations though  not exclusive. Suggest attention on follow-up. IMPRESSION: Viable 6 week 0 day intrauterine gestation without complicating features. Indeterminate well-circumscribed isoechoic lesion of the right ovary measuring 19 x 18 x 16 mm with subtle posterior acoustic enhancement and no internal vascularity. Findings may represent an involuting corpus luteum possibly a fibroma. Attention on follow up is suggested. Electronically Signed   By: Tollie Eth M.D.   On: 08/06/2018 21:38    Procedures Procedures (including critical care time)  Medications Ordered in ED Medications - No data to display   Initial Impression / Assessment and Plan / ED Course  I have reviewed the triage vital signs and the nursing notes.  Pertinent labs & imaging results that were available during my care of the patient were reviewed by me and considered in my medical decision making (see chart for details).   Patient G2, P1 presents to the ER with complaints of pelvic pain over the past 1.5 weeks, progressively worsening, now localized to the left side.  On exam she has pelvic tenderness as well as bilateral adnexal tenderness that is worse than the left.  No peritoneal signs.  No vaginal bleeding.  Her pregnancy te  Patient's labs reviewed: No leukocytosis. No anemia.  No significant electrolyte disturbance, mild hypocalcemia at 8.6.  Urinalysis with rare bacteria, small amount of leukocytes, there are 6-10 squamous epithelial cells present-will culture and hold off on treatment of asymptomatic bacteriuria given contamination. Quantitative hCG 24,606- ultrasound confirms viable 6 week IUP without complicating features. There are findings of possible fibroma to R ovary region- will require follow up. No signs of ectopic. Patient appears safe for discharge with close OB follow up. Tylenol recommended for pain. I discussed results, treatment plan, need for follow-up, and return precautions with the patient & her significant other.  Provided opportunity for questions, patient & her  significant other confirmed understanding and are in agreement with plan.   Final Clinical Impressions(s) / ED Diagnoses   Final diagnoses:  Pelvic pain in pregnancy    ED Discharge Orders    None       Desmond Lope 08/06/18 2219    Gerhard Munch, MD 08/06/18 [phone]

## 2018-08-06 NOTE — ED Triage Notes (Signed)
Patient c/o left mid abdominal pain x 1 1/2 weeks. Pain increases with activity. Nausea and diarrhea today.

## 2018-08-06 NOTE — Discharge Instructions (Addendum)
You were seen in the emergency department today for pelvic pain and pregnancy.  Your pregnancy test is positive.  Your ultrasound confirmed a pregnancy estimated to be about [redacted] weeks along.  Your ultrasound also showed some changes around your right ovary which could represent a fibroma, this will need follow-up discussion with your OB.  Please take Tylenol per over-the-counter dosing to help with pain.  We would like you to follow-up closely with the women's health clinic in the next 3 to 5 days.  Return to the ER or go to the MA you should you experience new or worsening symptoms including but not limited to worsening pain, vaginal bleeding, inability to keep fluids down, fever, or any other concerns.

## 2018-08-06 NOTE — ED Notes (Signed)
Pt unhooked and ambulated to the restroom without difficulty

## 2018-08-07 LAB — GC/CHLAMYDIA PROBE AMP (~~LOC~~) NOT AT ARMC
Chlamydia: NEGATIVE
Neisseria Gonorrhea: NEGATIVE

## 2018-08-08 LAB — URINE CULTURE

## 2018-08-09 ENCOUNTER — Telehealth: Payer: Self-pay | Admitting: Emergency Medicine

## 2018-08-09 NOTE — Progress Notes (Signed)
ED Antimicrobial Stewardship Positive Culture Follow Up   Candice Davis is an 27 y.o. female who [facility] with a chief complaint of abdominal pain Chief Complaint  Patient presents with  . Abdominal Pain  . [redacted] weeks pregnant    Recent Results (from the past 720 hour(s))  Urine culture     Status: Abnormal   Collection Time: 08/06/18  3:12 PM  Result Value Ref Range Status   Specimen Description   Final    URINE, CLEAN CATCH Performed at [facility], [address] [place], [place] [address]    Special Requests   Final    NONE Performed at [facility], [address] [place], [place] [address]    Culture (A)  Final    >=100,000 COLONIES/mL LACTOBACILLUS SPECIES Standardized susceptibility testing for this organism is not available. Performed at [facility], [address] [place], [place] [address]    Report Status 08/08/2018 FINAL  Final  Wet prep, genital     Status: Abnormal   Collection Time: 08/06/18  4:31 PM  Result Value Ref Range Status   Yeast Wet Prep HPF POC NONE SEEN NONE SEEN Final   Trich, Wet Prep NONE SEEN NONE SEEN Final   Clue Cells Wet Prep HPF POC NONE SEEN NONE SEEN Final   WBC, Wet Prep HPF POC RARE (A) NONE SEEN Final   Sperm NONE SEEN  Final    Comment: Performed at [facility], [address] [place], [place] [address]    [x]  Patient discharged originally without antimicrobial agent and treatment is now indicated  New antibiotic prescription: amoxil 500mg  Tid x 5d  ED Provider: Claude MangesJohana Soto, PA-C   Candice PoseyJonathan  Anyela Davis 08/09/2018, 1:28 PM Clinical Pharmacist Monday - Friday phone -  [phone] Saturday - Sunday phone - [phone]

## 2018-08-09 NOTE — Telephone Encounter (Signed)
Post ED Visit - Positive Culture Follow-up: Successful Patient Follow-Up  Culture assessed and recommendations reviewed by:  []  Enzo BiNathan Batchelder, Pharm.D. []  Celedonio MiyamotoJeremy Frens, [address] BCPS AQ-ID []  Garvin FilaMike Maccia, Pharm.D., BCPS []  Georgina PillionElizabeth Martin, Pharm.D., BCPS []  [place] HillsMinh Pham, [address]D., BCPS, AAHIVP []  Estella HuskMichelle Turner, Pharm.D., BCPS, AAHIVP []  Lysle Pearlachel Rumbarger, PharmD, BCPS []  Phillips Climeshuy Dang, PharmD, BCPS []  Agapito GamesAlison Masters, PharmD, BCPS [x]  Daylene PoseyJonathan Oriet, PharmD  Positive urine culture  [x]  Patient discharged without antimicrobial prescription and treatment is now indicated []  Organism is resistant to prescribed ED discharge antimicrobial []  Patient with positive blood cultures  Changes discussed with ED provider: Claude MangesJohana Soto PA New antibiotic prescription Amoxil 500 mg PO TID x five days [facility] [phone] [facility]  Contacted patient, date 08/09/2018, time 1245  Norm ParcelShannon Virgie Kunda 08/09/2018, 12:44 PM

## 2018-08-15 NOTE — L&D Delivery Note (Addendum)
Patient is 28 y.o. G2P1001 [redacted]w[redacted]d admitted for IOL for PD   Delivery Note At 10:04 PM a viable female was delivered via Vaginal, Spontaneous (Presentation: Cephalic; ROA).  APGAR: 8, 9   Placenta status: Spontaneous, intact.  Cord: 3VC  Anesthesia:  epidural Episiotomy: None Lacerations: None Suture Repair: none Est. Blood Loss (mL):  <50cc  Mom to postpartum.  Baby to Couplet care / Skin to Skin.  Upon arrival patient was complete and pushing. She pushed with good maternal effort to deliver a healthy baby boy. Baby delivered without difficulty, was noted to have good tone and place on maternal abdomen for oral suctioning, drying and stimulation. Delayed cord clamping performed. Placenta delivered intact with 3V cord. Vaginal canal and perineum was inspected and found to be hemostatic. Pitocin was started and uterus massaged until bleeding slowed. Counts of sharps, instruments, and lap pads were all correct.   Matilde Haymaker, MD PGY-2 8/24/202010:24 PM  The above was performed under my direct supervision and guidance.

## 2018-09-13 LAB — OB RESULTS CONSOLE ABO/RH: RH Type: POSITIVE

## 2018-09-13 LAB — OB RESULTS CONSOLE RUBELLA ANTIBODY, IGM: Rubella: IMMUNE

## 2018-09-13 LAB — OB RESULTS CONSOLE HIV ANTIBODY (ROUTINE TESTING): HIV: NONREACTIVE

## 2018-09-13 LAB — OB RESULTS CONSOLE ANTIBODY SCREEN: Antibody Screen: NEGATIVE

## 2018-09-13 LAB — OB RESULTS CONSOLE RPR: RPR: NONREACTIVE

## 2018-09-13 LAB — OB RESULTS CONSOLE GC/CHLAMYDIA
Chlamydia: NEGATIVE
Gonorrhea: NEGATIVE

## 2018-09-13 LAB — OB RESULTS CONSOLE HEPATITIS B SURFACE ANTIGEN: Hepatitis B Surface Ag: NEGATIVE

## 2019-03-13 ENCOUNTER — Inpatient Hospital Stay (HOSPITAL_COMMUNITY)
Admission: AD | Admit: 2019-03-13 | Discharge: 2019-03-13 | Disposition: A | Discharge status: Home / Self Care | Payer: Self-pay | Attending: Obstetrics & Gynecology | Admitting: Obstetrics & Gynecology

## 2019-03-13 ENCOUNTER — Other Ambulatory Visit: Payer: Self-pay

## 2019-03-13 ENCOUNTER — Encounter (HOSPITAL_COMMUNITY): Payer: Self-pay | Admitting: Student

## 2019-03-13 DIAGNOSIS — Z3A37 37 weeks gestation of pregnancy: Secondary | ICD-10-CM | POA: Insufficient documentation

## 2019-03-13 DIAGNOSIS — O368130 Decreased fetal movements, third trimester, not applicable or unspecified: Secondary | ICD-10-CM | POA: Insufficient documentation

## 2019-03-13 DIAGNOSIS — Z3689 Encounter for other specified antenatal screening: Secondary | ICD-10-CM

## 2019-03-13 NOTE — Discharge Instructions (Signed)
Evaluación de los movimientos fetales °Fetal Movement Counts °Introducción °Nombre del paciente: ________________________________________________ Fecha de parto estimada: ____________________ °¿Qué es una evaluación de los movimientos fetales? ° °Una evaluación de los movimientos fetales es el registro del número de veces que siente que el bebé se mueve durante un cierto período de tiempo. Esto también se puede denominar recuento de patadas fetales. Una evaluación de movimientos fetales se recomienda a todas las embarazadas. Es posible que le indiquen que comience a evaluar los movimientos fetales desde la semana 28 de embarazo. °Preste atención cuando sienta que el bebé está más activo. Podrá detectar los ciclos en que el bebé duerme y está despierto. También podrá detectar que ciertas cosas hacen que su bebé se mueva más. Deberá realizar una evaluación de los movimientos fetales en las siguientes situaciones: °· Cuando el bebé está más activo habitualmente. °· A la misma hora, todos los días. °Un buen momento para evaluar los movimientos fetales es cuando está descansando, después de haber comido y bebido algo. °¿Cómo debo contar los movimientos fetales? °1. Encuentre un lugar tranquilo y cómodo. Siéntese o acuéstese de lado. °2. Anote la fecha, la hora de inicio y de finalización y la cantidad de movimientos que sintió entre esas dos horas. Lleve esta información a las visitas de control. °3. Cuente las pataditas, revoloteos, chasquidos, vueltas o pinchazos en un período de 2 horas. Debe sentir al menos 10 movimientos en 2 horas. °4. Cuando sienta 10 movimientos, puede dejar de contar. °5. Si no siente 10 movimientos en 2 horas, coma y beba algo. Luego, continúe descansando y contando durante 1 hora. Si siente al menos 4 movimientos durante esa hora, puede dejar de contar. °Comuníquese con un médico si: °· Siente menos de 4 movimientos en 2 horas. °· El bebé no se mueve tanto como suele hacerlo. °Fecha:  ____________ Hora de inicio: ____________ Hora de finalización: ____________ Movimientos: ____________ °Fecha: ____________ Hora de inicio: ____________ Hora de finalización: ____________ Movimientos: ____________ °Fecha: ____________ Hora de inicio: ____________ Hora de finalización: ____________ Movimientos: ____________ °Fecha: ____________ Hora de inicio: ____________ Hora de finalización: ____________ Movimientos: ____________ °Fecha: ____________ Hora de inicio: ____________ Hora de finalización: ____________ Movimientos: ____________ °Fecha: ____________ Hora de inicio: ____________ Hora de finalización: ____________ Movimientos: ____________ °Fecha: ____________ Hora de inicio: ____________ Hora de finalización: ____________ Movimientos: ____________ °Fecha: ____________ Hora de inicio: ____________ Hora de finalización: ____________ Movimientos: ____________ °Fecha: ____________ Hora de inicio: ____________ Hora de finalización: ____________ Movimientos: ____________ °Esta información no tiene como fin reemplazar el consejo del médico. Asegúrese de hacerle al médico cualquier pregunta que tenga. °Document Released: 11/08/2007 Document Revised: 11/04/2016 Document Reviewed: 09/10/2015 °Elsevier Patient Education © 2020 [employer] ° °

## 2019-03-13 NOTE — MAU Note (Signed)
Pt states that around 1530 she noticed the baby was not moving. She tried drinking water and position changes. She states she called the clinic and they told her to come in.   Pt states around 1800 she felt some mild movement.  Denies vaginal bleeding or LOF.   Reports ctx's throughout the day pt reports 5 ctx in 1 hour

## 2019-03-13 NOTE — MAU Provider Note (Signed)
History   [id number]   Chief Complaint  Patient presents with  . Decreased Fetal Movement    *Spanish interpreter used for this visit*  HPI Candice Davis is a 28 y.o. female  G2P1001 here with report of decreased fetal movement since 3 pm today.  Reports feeling the baby move approximately 0 times until 6 pm. Since being in MAU, fetal movement has returned. Denies vaginal bleeding or leaking of fluid.  Feels occasional contraction.  No LMP recorded. Patient is pregnant.  OB History  Gravida Para Term Preterm AB Living  2 1 1     1   SAB TAB Ectopic Multiple Live Births          1    # Outcome Date GA Lbr Len/2nd Weight Sex Delivery Anes PTL Lv  2 Current           1 Term 11/06/12 [redacted]w[redacted]d 12:22 / 00:50 3065 g F Vag-Spont EPI  LIV     Birth Comments: no anomalies noted    History reviewed. No pertinent past medical history.  Family History  Problem Relation Age of Onset  . Hypertension Mother   . Diabetes Sister   . Hypertension Maternal Grandmother   . Diabetes Maternal Grandmother   . Hyperlipidemia Maternal Grandmother     Social History   Socioeconomic History  . Marital status: Significant Other    Spouse name: Not on file  . Number of children: Not on file  . Years of education: Not on file  . Highest education level: Not on file  Occupational History  . Not on file  Social Needs  . Financial resource strain: Not on file  . Food insecurity    Worry: Not on file    Inability: Not on file  . Transportation needs    Medical: Not on file    Non-medical: Not on file  Tobacco Use  . Smoking status: Never Smoker  . Smokeless tobacco: Never Used  Substance and Sexual Activity  . Alcohol use: No  . Drug use: No  . Sexual activity: Not Currently    Birth control/protection: None  Lifestyle  . Physical activity    Days per week: Not on file    Minutes per session: Not on file  . Stress: Not on file  Relationships  . Social Herbalist on  phone: Not on file    Gets together: Not on file    Attends religious service: Not on file    Active member of club or organization: Not on file    Attends meetings of clubs or organizations: Not on file    Relationship status: Not on file  Other Topics Concern  . Not on file  Social History Narrative  . Not on file    No Known Allergies  No current facility-administered medications on file prior to encounter.    Current Outpatient Medications on File Prior to Encounter  Medication Sig Dispense Refill  . Multiple Vitamins-Calcium (ONE-A-DAY WOMENS PO) Take by mouth.       Review of Systems  Constitutional: Negative.   Gastrointestinal: Negative.   Genitourinary: Negative.      Physical Exam   Vitals:   03/13/19 1930  BP: 112/66  Pulse: 87  Resp: 16  Temp: 98.7 F (37.1 C)  SpO2: 95%    Physical Exam  Nursing note and vitals reviewed. Constitutional: She is oriented to person, place, and time. She appears well-developed and well-nourished. No distress.  HENT:  Head: Normocephalic and atraumatic.  Eyes: Conjunctivae are normal. Right eye exhibits no discharge. Left eye exhibits no discharge. No scleral icterus.  Neck: Normal range of motion.  Respiratory: Effort normal. No respiratory distress.  GI: Soft. There is no abdominal tenderness.  Neurological: She is alert and oriented to person, place, and time.  Skin: Skin is warm and dry. She is not diaphoretic.  Psychiatric: She has a normal mood and affect. Her behavior is normal. Judgment and thought content normal.   NST:  Baseline: 150 bpm, Variability: Good {> 6 bpm), Accelerations: Reactive and Decelerations: Absent  MAU Course  Procedures  MDM Reactive NST Fetal movement heard through monitor Pt report return of movement & reassured  Assessment and Plan  A: 1. Decreased fetal movements in third trimester, single or unspecified fetus   2. NST (non-stress test) reactive   3. [redacted] weeks gestation of  pregnancy    P: Discharge home Fetal movement form given Discussed reasons to return [facility]  Qiana Landgrebe, Denny PeonErin, NP 03/13/2019 8:04 PM

## 2019-04-02 ENCOUNTER — Telehealth (HOSPITAL_COMMUNITY): Payer: Self-pay | Admitting: *Deleted

## 2019-04-02 NOTE — Telephone Encounter (Signed)
Interpreter number [phone] Preadmission screen

## 2019-04-03 ENCOUNTER — Encounter (HOSPITAL_COMMUNITY): Payer: Self-pay | Admitting: *Deleted

## 2019-04-03 ENCOUNTER — Telehealth (HOSPITAL_COMMUNITY): Payer: Self-pay | Admitting: *Deleted

## 2019-04-03 NOTE — Telephone Encounter (Signed)
Preadmission screen Interpreter number [phone]

## 2019-04-04 ENCOUNTER — Other Ambulatory Visit (HOSPITAL_COMMUNITY): Payer: Self-pay | Admitting: Advanced Practice Midwife

## 2019-04-06 ENCOUNTER — Other Ambulatory Visit: Payer: Self-pay

## 2019-04-06 ENCOUNTER — Other Ambulatory Visit (HOSPITAL_COMMUNITY)
Admission: RE | Admit: 2019-04-06 | Discharge: 2019-04-06 | Disposition: A | Discharge status: Home / Self Care | Payer: HRSA Program | Source: Ambulatory Visit | Attending: Obstetrics & Gynecology | Admitting: Obstetrics & Gynecology

## 2019-04-06 DIAGNOSIS — Z20828 Contact with and (suspected) exposure to other viral communicable diseases: Secondary | ICD-10-CM | POA: Diagnosis not present

## 2019-04-06 DIAGNOSIS — Z01812 Encounter for preprocedural laboratory examination: Secondary | ICD-10-CM | POA: Insufficient documentation

## 2019-04-06 LAB — SARS CORONAVIRUS 2 (TAT 6-24 HRS): SARS Coronavirus 2: NEGATIVE

## 2019-04-06 NOTE — MAU Note (Signed)
Pt here for PAT covid swab, denies any symptoms. Swab collected 

## 2019-04-08 ENCOUNTER — Inpatient Hospital Stay (HOSPITAL_COMMUNITY): Payer: Medicaid Other | Admitting: Anesthesiology

## 2019-04-08 ENCOUNTER — Inpatient Hospital Stay (HOSPITAL_COMMUNITY)
Admission: RE | Admit: 2019-04-08 | Discharge: 2019-04-08 | Disposition: A | Discharge status: Home / Self Care | Payer: Medicaid Other | Source: Ambulatory Visit | Attending: Obstetrics & Gynecology | Admitting: Obstetrics & Gynecology

## 2019-04-08 ENCOUNTER — Encounter (HOSPITAL_COMMUNITY): Payer: Self-pay | Admitting: *Deleted

## 2019-04-08 ENCOUNTER — Inpatient Hospital Stay (HOSPITAL_COMMUNITY)
Admission: AD | Admit: 2019-04-08 | Discharge: 2019-04-10 | DRG: 807 | Disposition: A | Discharge status: Home / Self Care | Payer: Medicaid Other | Attending: Obstetrics & Gynecology | Admitting: Obstetrics & Gynecology

## 2019-04-08 ENCOUNTER — Other Ambulatory Visit: Payer: Self-pay

## 2019-04-08 DIAGNOSIS — O480 Post-term pregnancy: Secondary | ICD-10-CM | POA: Diagnosis present

## 2019-04-08 DIAGNOSIS — Z3A41 41 weeks gestation of pregnancy: Secondary | ICD-10-CM | POA: Diagnosis not present

## 2019-04-08 LAB — GROUP B STREP BY PCR: Group B strep by PCR: NEGATIVE

## 2019-04-08 LAB — CBC
HCT: 35.7 % — ABNORMAL LOW (ref 36.0–46.0)
Hemoglobin: 11.8 g/dL — ABNORMAL LOW (ref 12.0–15.0)
MCH: 28.4 pg (ref 26.0–34.0)
MCHC: 33.1 g/dL (ref 30.0–36.0)
MCV: 85.8 fL (ref 80.0–100.0)
Platelets: 225 10*3/uL (ref 150–400)
RBC: 4.16 MIL/uL (ref 3.87–5.11)
RDW: 17.2 % — ABNORMAL HIGH (ref 11.5–15.5)
WBC: 11.2 10*3/uL — ABNORMAL HIGH (ref 4.0–10.5)
nRBC: 0 % (ref 0.0–0.2)

## 2019-04-08 LAB — TYPE AND SCREEN
ABO/RH(D): O POS
Antibody Screen: NEGATIVE

## 2019-04-08 LAB — RPR: RPR Ser Ql: NONREACTIVE

## 2019-04-08 LAB — ABO/RH: ABO/RH(D): O POS

## 2019-04-08 MED ORDER — LIDOCAINE HCL (PF) 1 % IJ SOLN: 30.0000 mL | INTRAMUSCULAR | Status: DC | PRN

## 2019-04-08 MED ORDER — OXYTOCIN BOLUS FROM INFUSION
500.0000 mL | Freq: Once | INTRAVENOUS | Status: AC
  Administered 2019-04-08: 22:00:00 500 mL via INTRAVENOUS

## 2019-04-08 MED ORDER — OXYCODONE-ACETAMINOPHEN 5-325 MG PO TABS: 1.0000 | ORAL_TABLET | ORAL | Status: DC | PRN

## 2019-04-08 MED ORDER — EPHEDRINE 5 MG/ML INJ: 10.0000 mg | INTRAVENOUS | Status: DC | PRN

## 2019-04-08 MED ORDER — LACTATED RINGERS IV SOLN
INTRAVENOUS | Status: DC
  Administered 2019-04-08 (×3): via INTRAVENOUS

## 2019-04-08 MED ORDER — PHENYLEPHRINE 40 MCG/ML (10ML) SYRINGE FOR IV PUSH (FOR BLOOD PRESSURE SUPPORT): 80.0000 ug | PREFILLED_SYRINGE | INTRAVENOUS | Status: DC | PRN

## 2019-04-08 MED ORDER — LACTATED RINGERS IV SOLN: 500.0000 mL | INTRAVENOUS | Status: DC | PRN

## 2019-04-08 MED ORDER — SODIUM CHLORIDE (PF) 0.9 % IJ SOLN
INTRAMUSCULAR | Status: DC | PRN
  Administered 2019-04-08: 12 mL/h via EPIDURAL

## 2019-04-08 MED ORDER — OXYCODONE-ACETAMINOPHEN 5-325 MG PO TABS: 2.0000 | ORAL_TABLET | ORAL | Status: DC | PRN

## 2019-04-08 MED ORDER — TERBUTALINE SULFATE 1 MG/ML IJ SOLN: 0.2500 mg | Freq: Once | INTRAMUSCULAR | Status: DC | PRN

## 2019-04-08 MED ORDER — FENTANYL-BUPIVACAINE-NACL 0.5-0.125-0.9 MG/250ML-% EP SOLN
12.0000 mL/h | EPIDURAL | Status: DC | PRN
  Filled 2019-04-08: qty 250

## 2019-04-08 MED ORDER — OXYTOCIN 40 UNITS IN NORMAL SALINE INFUSION - SIMPLE MED
1.0000 m[IU]/min | INTRAVENOUS | Status: DC
  Administered 2019-04-08: 14:00:00 2 m[IU]/min via INTRAVENOUS
  Administered 2019-04-08: 16:00:00 10 m[IU]/min via INTRAVENOUS

## 2019-04-08 MED ORDER — DIPHENHYDRAMINE HCL 50 MG/ML IJ SOLN: 12.5000 mg | INTRAMUSCULAR | Status: DC | PRN

## 2019-04-08 MED ORDER — ACETAMINOPHEN 325 MG PO TABS: 650.0000 mg | ORAL_TABLET | ORAL | Status: DC | PRN

## 2019-04-08 MED ORDER — FENTANYL CITRATE (PF) 100 MCG/2ML IJ SOLN: 50.0000 ug | INTRAMUSCULAR | Status: DC | PRN

## 2019-04-08 MED ORDER — MISOPROSTOL 25 MCG QUARTER TABLET: 25.0000 ug | ORAL_TABLET | Freq: Once | ORAL | Status: DC

## 2019-04-08 MED ORDER — LACTATED RINGERS IV SOLN: 500.0000 mL | Freq: Once | INTRAVENOUS | Status: DC

## 2019-04-08 MED ORDER — FLEET ENEMA 7-19 GM/118ML RE ENEM: 1.0000 | ENEMA | RECTAL | Status: DC | PRN

## 2019-04-08 MED ORDER — MISOPROSTOL 25 MCG QUARTER TABLET
ORAL_TABLET | ORAL | Status: AC
  Administered 2019-04-08: 09:00:00 25 ug via VAGINAL
  Filled 2019-04-08: qty 1

## 2019-04-08 MED ORDER — LACTATED RINGERS IV SOLN
500.0000 mL | Freq: Once | INTRAVENOUS | Status: AC
  Administered 2019-04-08: 17:00:00 500 mL via INTRAVENOUS

## 2019-04-08 MED ORDER — LIDOCAINE HCL (PF) 1 % IJ SOLN
INTRAMUSCULAR | Status: DC | PRN
  Administered 2019-04-08: 5 mL via EPIDURAL

## 2019-04-08 MED ORDER — ONDANSETRON HCL 4 MG/2ML IJ SOLN: 4.0000 mg | Freq: Four times a day (QID) | INTRAMUSCULAR | Status: DC | PRN

## 2019-04-08 MED ORDER — SOD CITRATE-CITRIC ACID 500-334 MG/5ML PO SOLN: 30.0000 mL | ORAL | Status: DC | PRN

## 2019-04-08 MED ORDER — OXYTOCIN 40 UNITS IN NORMAL SALINE INFUSION - SIMPLE MED
2.5000 [IU]/h | INTRAVENOUS | Status: DC
  Administered 2019-04-08: 23:00:00 2.5 [IU]/h via INTRAVENOUS
  Filled 2019-04-08: qty 1000

## 2019-04-08 MED ORDER — HYDROXYZINE HCL 50 MG PO TABS: 50.0000 mg | ORAL_TABLET | Freq: Four times a day (QID) | ORAL | Status: DC | PRN

## 2019-04-08 NOTE — Progress Notes (Signed)
Labor Progress Note Candice Davis is a 28 y.o. G2P1001 at [redacted]w[redacted]d presented for IOL for post-dates. S: Patient starting to feel contractions but otherwise comfortable.  O:  BP 100/66   Pulse 73   Temp 97.8 F (36.6 C) (Axillary)   Resp 16   Ht 4\' 10"  (1.473 m)   Wt 81.6 kg   BMI 37.62 kg/m  EFM: 145, moderate variability, reactive, pos accels, no decels  CVE: Dilation: 2 Effacement (%): 50 Station: -2 Presentation: Vertex Exam by:: Chairty Toman, MD   A&P: 28 y.o. G2P1001 [redacted]w[redacted]d here for IOL for post-dates. #Labor: Progressing well. Foley inserted at this check. Will start Pit. #Pain: Epidural when patient desires. #FWB: Cat I #GBS negative (by PCR here and per patient outpatient)  Chauncey Mann, MD 1:37 PM

## 2019-04-08 NOTE — Discharge Summary (Signed)
Postpartum Discharge Summary     Patient Name: Candice Davis DOB: Dec 15, 1990 MRN: [id number]  Date of admission: 04/08/2019 Delivering Provider: Mirian MoFRANK, PETER   Date of discharge: 04/10/2019  Admitting diagnosis: INDUCTION Intrauterine pregnancy: 775w0d     Secondary diagnosis:  Active Problems:   Indication for care in labor or delivery   SVD (spontaneous vaginal delivery)  Additional problems: post dates     Discharge diagnosis: Term Pregnancy Delivered                                                                                                Post partum procedures:None  Augmentation: AROM, Pitocin, Cytotec and Foley Balloon  Complications: None  Hospital course:  Induction of Labor With Vaginal Delivery   28 y.o. yo G2P1001 at 545w0d was admitted to the hospital 04/08/2019 for induction of labor.  Indication for induction: Postdates.  Patient had an uncomplicated labor course as follows: Membrane Rupture Time/Date: 7:29 PM ,04/08/2019   Intrapartum Procedures: Episiotomy: None [1]                                         Lacerations:  None [1]  Patient had delivery of a Viable infant.  Information for the patient's newborn:  Candice Brookeavarro-Torres, Boy Giuliana [[id number]][[id number]]      04/08/2019  Details of delivery can be found in separate delivery note.  Patient had a routine postpartum course. Patient is discharged home 04/10/19.  Magnesium Sulfate recieved: No BMZ received: No  Physical exam  Vitals:   04/09/19 0900 04/09/19 1300 04/09/19 2130 04/10/19 0508  BP: 98/63 99/66 115/83 97/66  Pulse: 75 64 75 (!) 57  Resp: 17 18  16   Temp: 97.7 F (36.5 C) 98.1 F (36.7 C) 98.2 F (36.8 C) 97.9 F (36.6 C)  TempSrc: Oral Oral Oral Axillary  SpO2:      Weight:      Height:       General: alert and cooperative Lochia: appropriate Uterine Fundus: firm Incision: N/A DVT Evaluation: No evidence of DVT seen on physical exam. Labs: Lab Results  Component Value Date    WBC 18.6 (H) 04/09/2019   HGB 10.6 (L) 04/09/2019   HCT 32.0 (L) 04/09/2019   MCV 87.4 04/09/2019   PLT 221 04/09/2019   CMP Latest Ref Rng & Units 08/06/2018  Glucose 70 - 99 mg/dL 96  BUN 6 - 20 mg/dL 7  Creatinine 6.640.44 - 4.031.00 mg/dL 4.740.55  Sodium 259135 - 563145 mmol/L 135  Potassium 3.5 - 5.1 mmol/L 3.5  Chloride 98 - 111 mmol/L 107  CO2 22 - 32 mmol/L 24  Calcium 8.9 - 10.3 mg/dL 8.7(F8.6(L)  Total Protein 6.5 - 8.1 g/dL 6.9  Total Bilirubin 0.3 - 1.2 mg/dL 0.5  Alkaline Phos 38 - 126 U/L 54  AST 15 - 41 U/L 27  ALT 0 - 44 U/L 37    Discharge instruction: per After Visit Summary and "Baby and Me Booklet".  After visit meds:  Allergies as  of 04/10/2019   No Known Allergies     Medication List    TAKE these medications   ibuprofen 600 MG tablet Commonly known as: ADVIL Take 1 tablet (600 mg total) by mouth every 6 (six) hours.   ONE-A-DAY WOMENS PO Take by mouth.   senna-docusate 8.6-50 MG tablet Commonly known as: Senokot-S Take 2 tablets by mouth daily. Start taking on: April 11, 2019       Diet: routine diet  Activity: Advance as tolerated. Pelvic rest for 6 weeks.   Outpatient follow up:4 weeks Follow up Appt:No future appointments. Follow up Visit: Follow-up Information    Department, [facility] Schedule an appointment as soon as possible for a visit in 4 week(s).   Why: for your postpartum appointment Contact information: [place] [address] [phone]             Newborn Data: Live born female  Birth Weight:  4289g APGAR: 8/9  Newborn Delivery   Birth date/time: 04/08/2019 22:04:00 Delivery type: Vaginal, Spontaneous      Baby Feeding: Breast Disposition:home with mother   04/10/2019 Melina Schools, DO

## 2019-04-08 NOTE — Progress Notes (Signed)
I assisted Dr Valma Cava with interpretation during Epidural procedure, by Juliann Mule Spanish Medical Interpreter.

## 2019-04-08 NOTE — Progress Notes (Signed)
Labor Progress Note Candice Davis is a 28 y.o. G2P1001 at [redacted]w[redacted]d presented for IOL for post-dates. S: Patient now feeling more comfortable with Epidural.   O:  BP (!) 107/58   Pulse 67   Temp 98.6 F (37 C) (Oral)   Resp 16   Ht 4\' 10"  (1.473 m)   Wt 81.6 kg   SpO2 96%   BMI 37.62 kg/m  EFM: 145, moderate variability, reactive, pos accels, no decels  CVE: Dilation: 8 Effacement (%): 100 Station: +1 Presentation: Vertex Exam by: Oakes Mccready   A&P: 28 y.o. G2P1001 [redacted]w[redacted]d here for IOL for post-dates. #Labor: Progressing well. Intended to AROM with this check but no bag felt, only head/hair and therefore did not attempt #Pain: Epidural when patient desires. #FWB: Cat I #GBS negative (by PCR here and per patient outpatient)  Chauncey Mann, MD 7:32 PM

## 2019-04-08 NOTE — Anesthesia Preprocedure Evaluation (Signed)
Anesthesia Evaluation  Patient identified by MRN, date of birth, ID band Patient awake    Reviewed: Allergy & Precautions, NPO status , Patient's Chart, lab work & pertinent test results  Airway Mallampati: II  TM Distance: >3 FB Neck ROM: Full    Dental no notable dental hx. (+) Teeth Intact   Pulmonary neg pulmonary ROS,    Pulmonary exam normal breath sounds clear to auscultation       Cardiovascular Exercise Tolerance: Good negative cardio ROS Normal cardiovascular exam Rhythm:Regular Rate:Normal     Neuro/Psych negative neurological ROS  negative psych ROS   GI/Hepatic negative GI ROS, Neg liver ROS,   Endo/Other    Renal/GU negative Renal ROS     Musculoskeletal   Abdominal (+) + obese,   Peds  Hematology Hgb 11.8 Plt 225   Anesthesia Other Findings Pt spanish speaking hx take w a translator  Reproductive/Obstetrics (+) Pregnancy                             Anesthesia Physical Anesthesia Plan  ASA: III  Anesthesia Plan: Epidural   Post-op Pain Management:    Induction:   PONV Risk Score and Plan:   Airway Management Planned:   Additional Equipment:   Intra-op Plan:   Post-operative Plan:   Informed Consent: I have reviewed the patients History and Physical, chart, labs and discussed the procedure including the risks, benefits and alternatives for the proposed anesthesia with the patient or authorized representative who has indicated his/her understanding and acceptance.       Plan Discussed with:   Anesthesia Plan Comments: (41 wk Spanish speaking  G2 P1 for LEA hx taken w interpreter)        Anesthesia Quick Evaluation

## 2019-04-08 NOTE — Anesthesia Procedure Notes (Signed)
Epidural Patient location during procedure: OB Start time: 04/08/2019 4:58 PM End time: 04/08/2019 5:11 PM  Staffing Anesthesiologist: Barnet Glasgow, MD Performed: anesthesiologist   Preanesthetic Checklist Completed: patient identified, site marked, surgical consent, pre-op evaluation, timeout performed, IV checked, risks and benefits discussed and monitors and equipment checked  Epidural Patient position: sitting Prep: site prepped and draped and DuraPrep Patient monitoring: continuous pulse ox and blood pressure Approach: midline Location: L3-L4 Injection technique: LOR air  Needle:  Needle type: Tuohy  Needle gauge: 17 G Needle length: 9 cm and 9 Needle insertion depth: 7 cm Catheter type: closed end flexible Catheter size: 19 Gauge Catheter at skin depth: 12 cm Test dose: negative  Assessment Events: blood not aspirated, injection not painful, no injection resistance, negative IV test and no paresthesia  Additional Notes Patient identified. Risks/Benefits/Options discussed with patient including but not limited to bleeding, infection, nerve damage, paralysis, failed block, incomplete pain control, headache, blood pressure changes, nausea, vomiting, reactions to medication both or allergic, itching and postpartum back pain. Confirmed with bedside nurse the patient's most recent platelet count. Confirmed with patient that they are not currently taking any anticoagulation, have any bleeding history or any family history of bleeding disorders. Patient expressed understanding and wished to proceed. All questions were answered. Sterile technique was used throughout the entire procedure. Please see nursing notes for vital signs. Test dose was given through epidural needle and negative prior to continuing to dose epidural or start infusion. Warning signs of high block given to the patient including shortness of breath, tingling/numbness in hands, complete motor block, or any  concerning symptoms with instructions to call for help. Patient was given instructions on fall risk and not to get out of bed. All questions and concerns addressed with instructions to call with any issues.1  Attempt (S) . Patient tolerated procedure well.

## 2019-04-08 NOTE — H&P (Addendum)
OBSTETRIC ADMISSION HISTORY AND PHYSICAL  Candice FavorYesenia Davis is a 28 y.o. female G2P1001 with IUP at 5324w0d by 6 wk US presenting for IOL secondary to post-dates. She reports +FMs, No LOF, no VB, no blurry vision, headaches or peripheral edema, and RUQ pain.  She plans on breast feeding. She requests Nexplanon for birth control. [facility]   Dating: By 6 wk US --->  Estimated Date of Delivery: 04/01/19  Sono:  @[redacted]w[redacted]d , CWD, normal anatomy, breech presentation, posterior placenta, 348g, 66% EFW   Prenatal History/Complications: IOL for oligo with first pregnancy  Past Medical History: History reviewed. No pertinent past medical history.  Past Surgical History: Past Surgical History:  Procedure Laterality Date  . NO PAST SURGERIES      Obstetrical History: OB History    Gravida  2   Para  1   Term  1   Preterm      AB      Living  1     SAB      TAB      Ectopic      Multiple      Live Births  1           Social History: Social History   Socioeconomic History  . Marital status: Significant Other    Spouse name: Not on file  . Number of children: Not on file  . Years of education: Not on file  . Highest education level: Not on file  Occupational History  . Not on file  Social Needs  . Financial resource strain: Not on file  . Food insecurity    Worry: Not on file    Inability: Not on file  . Transportation needs    Medical: Not on file    Non-medical: Not on file  Tobacco Use  . Smoking status: Never Smoker  . Smokeless tobacco: Never Used  Substance and Sexual Activity  . Alcohol use: No  . Drug use: No  . Sexual activity: Not Currently    Birth control/protection: None  Lifestyle  . Physical activity    Days per week: Not on file    Minutes per session: Not on file  . Stress: Not on file  Relationships  . Social Musicianconnections    Talks on phone: Not on file    Gets together: Not on file    Attends  religious service: Not on file    Active member of club or organization: Not on file    Attends meetings of clubs or organizations: Not on file    Relationship status: Not on file  Other Topics Concern  . Not on file  Social History Narrative  . Not on file    Family History: Family History  Problem Relation Age of Onset  . Hypertension Mother   . Diabetes Sister   . Diabetes Maternal Grandmother   . Diabetes Maternal Uncle     Allergies: No Known Allergies  Medications Prior to Admission  Medication Sig Dispense Refill Last Dose  . Multiple Vitamins-Calcium (ONE-A-DAY WOMENS PO) Take by mouth.        Review of Systems   All systems reviewed and negative except as stated in HPI  Blood pressure 102/71, pulse 78, temperature 98.5 F (36.9 C), temperature source Oral, resp. rate 16, height 4\' 10"  (1.473 m), weight 81.6 kg, unknown if currently breastfeeding. General appearance: alert, cooperative, appears stated age and no distress Lungs: normal effort  Heart: regular  rate  Abdomen: soft, non-tender; bowel sounds normal Pelvic: gravid uterus  Extremities: Homans sign is negative, no sign of DVT Presentation: cephalic by exam Fetal monitoringBaseline: 145 bpm, Variability: Good {> 6 bpm), Accelerations: Reactive and Decelerations: Absent Uterine activity: None Dilation: 1.5 Effacement (%): 50 Station: -2 Exam by:: Kweku Stankey, MD   Prenatal labs: ABO, Rh: O pos Antibody: Neg Rubella: Immune (01/30 0000) RPR: Nonreactive (01/30 0000)  HBsAg: Negative (01/30 0000)  HIV: Non-reactive (01/30 0000)  GBS:   neg per patient; will repeat and request records 1 hr Glucola 91 Genetic screening  none Anatomy US normal  Prenatal Transfer Tool  Maternal Diabetes: No Genetic Screening: Normal Maternal Ultrasounds/Referrals: Normal Fetal Ultrasounds or other Referrals:  None Maternal Substance Abuse:  No Significant Maternal Medications:  None Significant Maternal Lab  Results: Group B Strep negative  Results for orders placed or performed during the hospital encounter of 04/08/19 (from the past 24 hour(s))  Type and screen   Collection Time: 04/08/19  8:39 AM  Result Value Ref Range   ABO/RH(D) PENDING    Antibody Screen PENDING    Sample Expiration      04/11/2019,2359 Performed at [facility], [address], [place], [place] [address]   CBC   Collection Time: 04/08/19  8:44 AM  Result Value Ref Range   WBC 11.2 (H) 4.0 - 10.5 K/uL   RBC 4.16 3.87 - 5.11 MIL/uL   Hemoglobin 11.8 (L) 12.0 - 15.0 g/dL   HCT 35.7 (L) 36.0 - 46.0 %   MCV 85.8 80.0 - 100.0 fL   MCH 28.4 26.0 - 34.0 pg   MCHC 33.1 30.0 - 36.0 g/dL   RDW 17.2 (H) 11.5 - 15.5 %   Platelets 225 150 - 400 K/uL   nRBC 0.0 0.0 - 0.2 %    Patient Active Problem List   Diagnosis Date Noted  . Indication for care in labor or delivery 04/08/2019    Assessment/Plan:  Candice Davis is a 28 y.o. G2P1001 at [redacted]w[redacted]d here for IOL for post-dates.  #Labor: Cytotec placed. SVE 1.5/50/-2; not feeling ctx #Pain: Epidural as desires #FWB: Cat I, EFW: 3600g #ID:  GBS neg per patient; will request records and repeat by PCR #MOF: breast #MOC: Nexplanon #Circ: Merilyn Baba, MD  04/08/2019, 9:50 AM

## 2019-04-09 LAB — CBC
HCT: 32 % — ABNORMAL LOW (ref 36.0–46.0)
Hemoglobin: 10.6 g/dL — ABNORMAL LOW (ref 12.0–15.0)
MCH: 29 pg (ref 26.0–34.0)
MCHC: 33.1 g/dL (ref 30.0–36.0)
MCV: 87.4 fL (ref 80.0–100.0)
Platelets: 221 10*3/uL (ref 150–400)
RBC: 3.66 MIL/uL — ABNORMAL LOW (ref 3.87–5.11)
RDW: 17.2 % — ABNORMAL HIGH (ref 11.5–15.5)
WBC: 18.6 10*3/uL — ABNORMAL HIGH (ref 4.0–10.5)
nRBC: 0 % (ref 0.0–0.2)

## 2019-04-09 MED ORDER — ACETAMINOPHEN 325 MG PO TABS
650.0000 mg | ORAL_TABLET | ORAL | Status: DC | PRN
  Administered 2019-04-09: 650 mg via ORAL
  Filled 2019-04-09: qty 2

## 2019-04-09 MED ORDER — DIPHENHYDRAMINE HCL 25 MG PO CAPS: 25.0000 mg | ORAL_CAPSULE | Freq: Four times a day (QID) | ORAL | Status: DC | PRN

## 2019-04-09 MED ORDER — ONDANSETRON HCL 4 MG PO TABS: 4.0000 mg | ORAL_TABLET | ORAL | Status: DC | PRN

## 2019-04-09 MED ORDER — PRENATAL MULTIVITAMIN CH
1.0000 | ORAL_TABLET | Freq: Every day | ORAL | Status: DC
  Administered 2019-04-09: 13:00:00 1 via ORAL
  Filled 2019-04-09: qty 1

## 2019-04-09 MED ORDER — SIMETHICONE 80 MG PO CHEW: 80.0000 mg | CHEWABLE_TABLET | ORAL | Status: DC | PRN

## 2019-04-09 MED ORDER — TETANUS-DIPHTH-ACELL PERTUSSIS 5-2.5-18.5 LF-MCG/0.5 IM SUSP: 0.5000 mL | Freq: Once | INTRAMUSCULAR | Status: DC

## 2019-04-09 MED ORDER — IBUPROFEN 600 MG PO TABS
600.0000 mg | ORAL_TABLET | Freq: Four times a day (QID) | ORAL | Status: DC
  Administered 2019-04-09 – 2019-04-10 (×5): 600 mg via ORAL
  Filled 2019-04-09 (×5): qty 1

## 2019-04-09 MED ORDER — WITCH HAZEL-GLYCERIN EX PADS: 1.0000 "application " | MEDICATED_PAD | CUTANEOUS | Status: DC | PRN

## 2019-04-09 MED ORDER — DIBUCAINE (PERIANAL) 1 % EX OINT: 1.0000 "application " | TOPICAL_OINTMENT | CUTANEOUS | Status: DC | PRN

## 2019-04-09 MED ORDER — COCONUT OIL OIL
1.0000 "application " | TOPICAL_OIL | Status: DC | PRN
  Administered 2019-04-09: 1 via TOPICAL

## 2019-04-09 MED ORDER — SENNOSIDES-DOCUSATE SODIUM 8.6-50 MG PO TABS
2.0000 | ORAL_TABLET | ORAL | Status: DC
  Administered 2019-04-09: 2 via ORAL
  Filled 2019-04-09: qty 2

## 2019-04-09 MED ORDER — ZOLPIDEM TARTRATE 5 MG PO TABS: 5.0000 mg | ORAL_TABLET | Freq: Every evening | ORAL | Status: DC | PRN

## 2019-04-09 MED ORDER — BENZOCAINE-MENTHOL 20-0.5 % EX AERO: 1.0000 "application " | INHALATION_SPRAY | CUTANEOUS | Status: DC | PRN

## 2019-04-09 MED ORDER — ONDANSETRON HCL 4 MG/2ML IJ SOLN: 4.0000 mg | INTRAMUSCULAR | Status: DC | PRN

## 2019-04-09 NOTE — Discharge Instructions (Signed)
Parto vaginal, cuidados de puerperio Postpartum Care After Vaginal Delivery Lea esta informacin sobre cmo cuidarse desde el momento en que nazca su beb y hasta 6 a 12 semanas despus del parto (perodo del posparto). El mdico tambin podr darle instrucciones ms especficas. Comunquese con su mdico si tiene problemas o preguntas. Siga estas indicaciones en su casa: Hemorragia vaginal  Es normal tener un poco de hemorragia vaginal (loquios) despus del parto. Use un apsito sanitario para el sangrado vaginal y secrecin. ? Durante la primera semana despus del parto, la cantidad y el aspecto de los loquios a menudo es similar a las del perodo menstrual. ? Durante las siguientes semanas disminuir gradualmente hasta convertirse en una secrecin seca amarronada o amarillenta. ? En la mayora de las mujeres, los loquios se detienen completamente entre 4 a 6semanas despus del parto. Los sangrados vaginales pueden variar de mujer a mujer.  Cambie los apsitos sanitarios con frecuencia. Observe si hay cambios en el flujo, como: ? Un aumento repentino en el volumen. ? Cambio en el color. ? Cogulos sanguneos grandes.  Si expulsa un cogulo de sangre por la vagina, gurdelo y llame al mdico para informrselo. No deseche los cogulos de sangre por el inodoro antes de hablar con su mdico.  No use tampones ni se haga duchas vaginales hasta que el mdico la autorice.  Si no est amamantando, volver a tener su perodo entre 6 y 8 semanas despus del parto. Si solamente alimenta al beb con leche materna (lactancia materna exclusiva), podra no volver a tener su perodo hasta que deje de amamantar. Cuidados perineales  Mantenga la zona entre la vagina y el ano (perineo) limpia y seca, como se lo haya indicado el mdico. Utilice apsitos o aerosoles analgsicos y cremas, como se lo hayan indicado.  Si le hicieron un corte en el perineo (episiotoma) o tuvo un desgarro en la vagina, controle la  zona para detectar signos de infeccin hasta que sane. Est atenta a los siguientes signos: ? Aumento del enrojecimiento, la hinchazn o el dolor. ? Presenta lquido o sangre que supura del corte o desgarro. ? Calor. ? Pus o mal olor.  Es posible que le den una botella rociadora para que use en lugar de limpiarse el rea con papel higinico despus de usar el bao. Cuando comience a sanar, podr usar la botella rociadora antes de secarse. Asegrese de secarse suavemente.  Para aliviar el dolor causado por una episiotoma, un desgarro en la vagina o venas hinchadas en el ano (hemorroides), trate de tomar un bao de asiento tibio 2 o 3 veces por da. Un bao de asiento es un bao de agua tibia que se toma mientras se est sentado. El agua solo debe llegar hasta las caderas y cubrir las nalgas. Cuidado de las mamas  En los primeros das despus del parto, las mamas pueden sentirse pesadas, llenas e incmodas (congestin mamaria). Tambin puede escaparse leche de sus senos. El mdico puede sugerirle mtodos para aliviar este malestar. La congestin mamaria debera desaparecer al cabo de unos das.  Si est amamantando: ? Use un sostn que sujete y ajuste bien sus pechos. ? Mantenga los pezones secos y limpios. Aplquese cremas y ungentos, como se lo haya indicado el mdico. ? Es posible que deba usar discos de algodn en el sostn para absorber la leche que se filtre de sus senos. ? Puede tener contracciones uterinas cada vez que amamante durante varias semanas despus del parto. Las contracciones uterinas ayudan al tero a   regresar a su tamao habitual. ? Si tiene algn problema con la lactancia materna, colabore con el mdico o un asesor en lactancia.  Si no est amamantando: ? Evite tocarse mucho las mamas. Al hacerlo, podran producir ms leche. ? Use un sostn que le proporcione el ajuste correcto y compresas fras para reducir la hinchazn. ? No extraiga (saque) leche materna. Esto har que  produzca ms leche. Intimidad y sexualidad  Pregntele al mdico cundo puede retomar la actividad sexual. Esto puede depender de lo siguiente: ? Su riesgo de sufrir infecciones. ? La rapidez con la que est sanando. ? Su comodidad y deseo de retomar la actividad sexual.  Despus del parto, puede quedar embarazada incluso si no ha tenido todava su perodo. Si lo desea, hable con el mdico acerca de los mtodos de control de la natalidad (mtodos anticonceptivos). Medicamentos  Tome los medicamentos de venta libre y los recetados solamente como se lo haya indicado el mdico.  Si le recetaron un antibitico, tmelo como se lo haya indicado el mdico. No deje de tomar el antibitico aunque comience a sentirse mejor. Actividad  Retome sus actividades normales de a poco como se lo haya indicado el mdico. Pregntele al mdico qu actividades son seguras para usted.  Descanse todo lo que pueda. Trate de descansar o tomar una siesta mientras el beb duerme. Comida y bebida   Beba suficiente lquido como para mantener la orina de color amarillo plido.  Coma alimentos ricos en fibras todos los das. Estos pueden ayudarla a prevenir o aliviar el estreimiento. Los alimentos ricos en fibras incluyen, entre otros: ? Panes y cereales integrales. ? Arroz integral. ? Frijoles. ? Frutas y verduras frescas.  No intente perder de peso rpidamente reduciendo el consumo de caloras.  Tome sus vitaminas prenatales hasta la visita de seguimiento de posparto o hasta que su mdico le indique que puede dejar de tomarlas. Estilo de vida  No consuma ningn producto que contenga nicotina o tabaco, como cigarrillos y cigarrillos electrnicos. Si necesita ayuda para dejar de fumar, consulte al mdico.  No beba alcohol, especialmente si est amamantando. Instrucciones generales  Concurra a todas las visitas de seguimiento para usted y el beb, como se lo haya indicado el mdico. La mayora de las mujeres  visita al mdico para un seguimiento de posparto dentro de las primeras 3 a 6 semanas despus del parto. Comunquese con un mdico si:  Se siente incapaz de controlar los cambios que implica tener un hijo y esos sentimientos no desaparecen.  Siente tristeza o preocupacin de forma inusual.  Las mamas se ponen rojas, le duelen o se endurecen.  Tiene fiebre.  Tiene dificultad para retener la orina o para impedir que la orina se escape.  Tiene poco inters o falta de inters en actividades que solan gustarle.  No ha amamantado nada y no ha tenido un perodo menstrual durante 12 semanas despus del parto.  Dej de amamantar al beb y no ha tenido su perodo menstrual durante 12 semanas despus de dejar de amamantar.  Tiene preguntas sobre su cuidado y el del beb.  Elimina un cogulo de sangre grande por la vagina. Solicite ayuda de inmediato si:  Siente dolor en el pecho.  Tiene dificultad para respirar.  Tiene un dolor repentino e intenso en la pierna.  Tiene dolor intenso o clicos en el la parte inferior del abdomen.  Tiene una hemorragia tan intensa de la vagina que empapa ms de un apsito en una hora. El sangrado   no debe ser ms abundante que el perodo ms intenso que haya tenido.  Dolor de cabeza intenso.  Se desmaya.  Tiene visin borrosa o manchas en la vista.  Tiene secrecin vaginal con mal olor.  Tiene pensamientos acerca de lastimarse a usted misma o a su beb. Si alguna vez siente que puede lastimarse a usted misma o a otras personas, o tiene pensamientos de poner fin a su vida, busque ayuda de inmediato. Puede dirigirse al departamento de emergencias ms cercano o llamar a:  El servicio de emergencias de su localidad (911 en [place]  Una lnea de asistencia al suicida y atencin en crisis, como la Lnea Nacional de Prevencin del Suicidio (National Suicide Prevention Lifeline), al 1-800-273-8255. Est disponible las 24 horas del da. Resumen  El  perodo de tiempo justo despus el parto y hasta 6 a 12 semanas despus del parto se denomina perodo posparto.  Retome sus actividades normales de a poco como se lo haya indicado el mdico.  Concurra a todas las visitas de seguimiento para usted y el beb, como se lo haya indicado el mdico. Esta informacin no tiene como fin reemplazar el consejo del mdico. Asegrese de hacerle al mdico cualquier pregunta que tenga. Document Released: 05/29/2007 Document Revised: 11/11/2017 Document Reviewed: 07/23/2017 Elsevier Patient Education  2020 [employer]vier Inc.  

## 2019-04-09 NOTE — Progress Notes (Signed)
Post Partum Day #1 Subjective: no complaints, up ad lib and tolerating PO; breastfeeding going well; plans on outpatient Nexplanon for contraception; would like to go home today but infant won't be 24hrs of age until 2200  Objective: Blood pressure 98/63, pulse 75, temperature 97.7 F (36.5 C), temperature source Oral, resp. rate 17, height 4\' 10"  (1.473 m), weight 81.6 kg, SpO2 98 %, unknown if currently breastfeeding.  Physical Exam:  General: alert, cooperative and no distress Lochia: appropriate Uterine Fundus: firm DVT Evaluation: No evidence of DVT seen on physical exam.  Recent Labs    04/08/19 0844 04/09/19 0612  HGB 11.8* 10.6*  HCT 35.7* 32.0*    Assessment/Plan: Plan for discharge tomorrow  Encouraged to make postpartum appt at [facility]   LOS: 1 day   Myrtis Ser CNM 04/09/2019, 12:42 PM

## 2019-04-09 NOTE — Progress Notes (Signed)
Patient declined need for interpreter; states she understands english and what she has questions about she would feel more comfortable with her husband translating for her. Both patient and husband signed waiver for interpreter.

## 2019-04-09 NOTE — Lactation Note (Addendum)
This note was copied from a baby's chart. Lactation Consultation Note  Patient Name: Candice Davis'Z Date: 04/09/2019 Reason for consult: Initial assessment;Other (Comment);Term(Spanish interpreter [place] present to interpreter - Mom signed the consent for for dad to interprete - dad not visiting at this time)  Baby is 87 hours old  As LC entered the room. Mom sitting up in bed and baby on her lap sound asleep. Per mom last fed at 12N for 15 mins, noted swallows and leaking out of the other breast.  Mom mentioned she is breast feeding mostly the football.  LC recommended since her baby is large every feeding after the 1st breast  Burp the baby up and offer the 2nd breast. Also prior to latch breast massage, hand express, to enhance the flow.  LC encouraged mom to call [place] when showing feeding cues on the nurses light.  Mom confirmed one wet and one stool.  LC provided the pamphlet and reviewed the [facility] resources after D/C.    Maternal Data Has patient been taught Hand Expression?: Yes Does the patient have breastfeeding experience prior to this delivery?: Yes  Feeding Feeding Type: (per mom , per interpreter - baby last fed at 1215 for 15 mins)  LATCH Score  ( This latch score by the [facility] )  Latch: Grasps breast easily, tongue down, lips flanged, rhythmical sucking.  Audible Swallowing: A few with stimulation  Type of Nipple: Everted at rest and after stimulation  Comfort (Breast/Nipple): Soft / non-tender  Hold (Positioning): Assistance needed to correctly position infant at breast and maintain latch.  LATCH Score: 8  Interventions Interventions: Breast feeding basics reviewed  Lactation Tools Discussed/Used     Consult Status Consult Status: Follow-up Date: 04/09/19 Follow-up type: In-patient    [place] 04/09/2019, 12:56 PM

## 2019-04-09 NOTE — Anesthesia Postprocedure Evaluation (Signed)
Anesthesia Post Note  Patient: Candice Davis  Procedure(s) Performed: AN AD HOC LABOR EPIDURAL     Patient location during evaluation: Mother Baby Anesthesia Type: Epidural Level of consciousness: awake and alert Pain management: pain level controlled Vital Signs Assessment: post-procedure vital signs reviewed and stable Respiratory status: spontaneous breathing, nonlabored ventilation and respiratory function stable Cardiovascular status: stable Postop Assessment: no headache, no backache and epidural receding Anesthetic complications: no    Last Vitals:  Vitals:   04/09/19 0251 04/09/19 0542  BP:  110/62  Pulse:  67  Resp:  18  Temp: 37.1 C 37.2 C  SpO2:  98%    Last Pain:  Vitals:   04/09/19 0542  TempSrc: Oral  PainSc:    Pain Goal:                   [employer]

## 2019-04-10 DIAGNOSIS — Z3A41 41 weeks gestation of pregnancy: Secondary | ICD-10-CM

## 2019-04-10 DIAGNOSIS — O480 Post-term pregnancy: Secondary | ICD-10-CM

## 2019-04-10 LAB — BIRTH TISSUE RECOVERY COLLECTION (PLACENTA DONATION)

## 2019-04-10 MED ORDER — SENNOSIDES-DOCUSATE SODIUM 8.6-50 MG PO TABS: 2.0000 | ORAL_TABLET | ORAL | 0 refills | Status: AC

## 2019-04-10 MED ORDER — IBUPROFEN 600 MG PO TABS: 600.0000 mg | ORAL_TABLET | Freq: Four times a day (QID) | ORAL | 1 refills | Status: AC

## 2019-04-10 NOTE — Lactation Note (Signed)
This note was copied from a baby's chart. Lactation Consultation Note  Patient Name: Candice Davis [id number] Date: 04/10/2019   P2, Baby 49 hours old and latched upon entering. Spanish video interpreter used but mother mostly spoke [place]. In house [place] interpreter busy in MAU at the time of [facility] consult. Reviewed how to flange bottom lip. Mother states baby breastfed frequently through the night.  Provided education regarding cluster feeding. Mother states she plans to exclusively breastfeed.  Feed on demand approximately 8-12 times per day.   Reviewed engorgement care and monitoring voids/stools. Provided mother with manual pump.      Maternal Data    Feeding    LATCH Score                   Interventions    Lactation Tools Discussed/Used     Consult Status      Carlye Grippe 04/10/2019, 9:18 AM

## 2019-04-10 NOTE — Progress Notes (Signed)
POSTPARTUM PROGRESS NOTE  Post Partum Day 2  Subjective:  Candice Davis is a 28 y.o. [id number] s/p SVD at [redacted]w[redacted]d.  She reports she is doing well. No acute events overnight. She denies any problems with ambulating, voiding or po intake. Denies nausea or vomiting.  Pain is well controlled.  Lochia is decreased, only using the smaller pads now.  Objective: Blood pressure 97/66, pulse (!) 57, temperature 97.9 F (36.6 C), temperature source Axillary, resp. rate 16, height 4\' 10"  (1.473 m), weight 81.6 kg, SpO2 98 %, unknown if currently breastfeeding.  Physical Exam:  General: alert, cooperative and no distress Chest: no respiratory distress Heart:regular rate, distal pulses intact Abdomen: soft, nontender,  Uterine Fundus: firm, appropriately tender DVT Evaluation: No calf swelling or tenderness Extremities: no edema Skin: warm, dry  Recent Labs    04/08/19 0844 04/09/19 0612  HGB 11.8* 10.6*  HCT 35.7* 32.0*    Assessment/Plan: Candice Davis is a 28 y.o. [id number] s/p SVD at [redacted]w[redacted]d   PPD#2 - Doing well  Routine postpartum care  Contraception: Nexplanon outpatient Feeding: breast Dispo: Plan for discharge today 04/10/2019.   LOS: 2 days   Belenda Cruise, Medical Student 04/10/2019, 7:46 AM

## 2020-01-14 ENCOUNTER — Other Ambulatory Visit: Payer: Self-pay

## 2020-01-14 ENCOUNTER — Emergency Department (HOSPITAL_COMMUNITY): Payer: Self-pay

## 2020-01-14 ENCOUNTER — Inpatient Hospital Stay (HOSPITAL_COMMUNITY)
Admission: EM | Admit: 2020-01-14 | Discharge: 2020-01-17 | DRG: 549 | Disposition: A | Discharge status: Home / Self Care | Payer: Self-pay | Attending: Family Medicine | Admitting: Family Medicine

## 2020-01-14 ENCOUNTER — Encounter (HOSPITAL_COMMUNITY): Payer: Self-pay

## 2020-01-14 DIAGNOSIS — Z20822 Contact with and (suspected) exposure to covid-19: Secondary | ICD-10-CM | POA: Diagnosis present

## 2020-01-14 DIAGNOSIS — M2550 Pain in unspecified joint: Secondary | ICD-10-CM

## 2020-01-14 DIAGNOSIS — A5442 Gonococcal arthritis: Principal | ICD-10-CM | POA: Diagnosis present

## 2020-01-14 DIAGNOSIS — M25562 Pain in left knee: Secondary | ICD-10-CM

## 2020-01-14 DIAGNOSIS — Z8249 Family history of ischemic heart disease and other diseases of the circulatory system: Secondary | ICD-10-CM

## 2020-01-14 DIAGNOSIS — A5486 Gonococcal sepsis: Secondary | ICD-10-CM

## 2020-01-14 DIAGNOSIS — R52 Pain, unspecified: Secondary | ICD-10-CM

## 2020-01-14 DIAGNOSIS — Z833 Family history of diabetes mellitus: Secondary | ICD-10-CM

## 2020-01-14 DIAGNOSIS — M25561 Pain in right knee: Secondary | ICD-10-CM

## 2020-01-14 DIAGNOSIS — A5402 Gonococcal vulvovaginitis, unspecified: Secondary | ICD-10-CM | POA: Diagnosis present

## 2020-01-14 DIAGNOSIS — M130 Polyarthritis, unspecified: Secondary | ICD-10-CM

## 2020-01-14 DIAGNOSIS — N390 Urinary tract infection, site not specified: Secondary | ICD-10-CM

## 2020-01-14 LAB — URINALYSIS, ROUTINE W REFLEX MICROSCOPIC
Bilirubin Urine: NEGATIVE
Glucose, UA: NEGATIVE mg/dL
Ketones, ur: NEGATIVE mg/dL
Nitrite: NEGATIVE
Protein, ur: NEGATIVE mg/dL
Specific Gravity, Urine: 1.016 (ref 1.005–1.030)
pH: 5 (ref 5.0–8.0)

## 2020-01-14 LAB — CBC WITH DIFFERENTIAL/PLATELET
Abs Immature Granulocytes: 0.03 10*3/uL (ref 0.00–0.07)
Basophils Absolute: 0 10*3/uL (ref 0.0–0.1)
Basophils Relative: 0 %
Eosinophils Absolute: 0.2 10*3/uL (ref 0.0–0.5)
Eosinophils Relative: 2 %
HCT: 38.8 % (ref 36.0–46.0)
Hemoglobin: 12.6 g/dL (ref 12.0–15.0)
Immature Granulocytes: 0 %
Lymphocytes Relative: 20 %
Lymphs Abs: 1.9 10*3/uL (ref 0.7–4.0)
MCH: 29 pg (ref 26.0–34.0)
MCHC: 32.5 g/dL (ref 30.0–36.0)
MCV: 89.4 fL (ref 80.0–100.0)
Monocytes Absolute: 0.8 10*3/uL (ref 0.1–1.0)
Monocytes Relative: 9 %
Neutro Abs: 6.3 10*3/uL (ref 1.7–7.7)
Neutrophils Relative %: 69 %
Platelets: 217 10*3/uL (ref 150–400)
RBC: 4.34 MIL/uL (ref 3.87–5.11)
RDW: 14.1 % (ref 11.5–15.5)
WBC: 9.2 10*3/uL (ref 4.0–10.5)
nRBC: 0 % (ref 0.0–0.2)

## 2020-01-14 LAB — WET PREP, GENITAL
Sperm: NONE SEEN
Trich, Wet Prep: NONE SEEN
Yeast Wet Prep HPF POC: NONE SEEN

## 2020-01-14 LAB — COMPREHENSIVE METABOLIC PANEL
ALT: 79 U/L — ABNORMAL HIGH (ref 0–44)
AST: 56 U/L — ABNORMAL HIGH (ref 15–41)
Albumin: 3.4 g/dL — ABNORMAL LOW (ref 3.5–5.0)
Alkaline Phosphatase: 99 U/L (ref 38–126)
Anion gap: 8 (ref 5–15)
BUN: 12 mg/dL (ref 6–20)
CO2: 22 mmol/L (ref 22–32)
Calcium: 8.6 mg/dL — ABNORMAL LOW (ref 8.9–10.3)
Chloride: 116 mmol/L — ABNORMAL HIGH (ref 98–111)
Creatinine, Ser: 0.46 mg/dL (ref 0.44–1.00)
GFR calc Af Amer: >60 mL/min (ref >60)
GFR calc non Af Amer: >60 mL/min (ref >60)
Glucose, Bld: 100 mg/dL — ABNORMAL HIGH (ref 70–99)
Potassium: 4 mmol/L (ref 3.5–5.1)
Sodium: 146 mmol/L — ABNORMAL HIGH (ref 135–145)
Total Bilirubin: 0.5 mg/dL (ref 0.3–1.2)
Total Protein: 6.6 g/dL (ref 6.5–8.1)

## 2020-01-14 LAB — LACTIC ACID, PLASMA: Lactic Acid, Venous: 0.7 mmol/L (ref 0.5–1.9)

## 2020-01-14 LAB — TROPONIN I (HIGH SENSITIVITY): Troponin I (High Sensitivity): <2 ng/L (ref <18)

## 2020-01-14 LAB — URIC ACID: Uric Acid, Serum: 3.2 mg/dL (ref 2.5–7.1)

## 2020-01-14 LAB — SARS CORONAVIRUS 2 BY RT PCR (HOSPITAL ORDER, PERFORMED IN ~~LOC~~ HOSPITAL LAB): SARS Coronavirus 2: NEGATIVE

## 2020-01-14 LAB — PROTIME-INR
INR: 1.1 (ref 0.8–1.2)
Prothrombin Time: 13.6 seconds (ref 11.4–15.2)

## 2020-01-14 LAB — HIV ANTIBODY (ROUTINE TESTING W REFLEX): HIV Screen 4th Generation wRfx: NONREACTIVE

## 2020-01-14 LAB — SEDIMENTATION RATE: Sed Rate: 32 mm/hr — ABNORMAL HIGH (ref 0–22)

## 2020-01-14 LAB — HCG, QUANTITATIVE, PREGNANCY: hCG, Beta Chain, Quant, S: <1 m[IU]/mL (ref <5)

## 2020-01-14 MED ORDER — HYDROCODONE-ACETAMINOPHEN 5-325 MG PO TABS
1.0000 | ORAL_TABLET | ORAL | Status: DC | PRN
  Administered 2020-01-14 – 2020-01-17 (×7): 2 via ORAL
  Filled 2020-01-14 (×7): qty 2

## 2020-01-14 MED ORDER — KETOROLAC TROMETHAMINE 30 MG/ML IJ SOLN
30.0000 mg | Freq: Once | INTRAMUSCULAR | Status: AC
  Administered 2020-01-14: 30 mg via INTRAVENOUS
  Filled 2020-01-14: qty 1

## 2020-01-14 MED ORDER — KETOROLAC TROMETHAMINE 30 MG/ML IJ SOLN: 30.0000 mg | Freq: Once | INTRAMUSCULAR | Status: DC

## 2020-01-14 MED ORDER — SODIUM CHLORIDE 0.9 % IV SOLN
500.0000 mg | Freq: Once | INTRAVENOUS | Status: AC
  Administered 2020-01-14: 500 mg via INTRAVENOUS
  Filled 2020-01-14: qty 500

## 2020-01-14 MED ORDER — LACTATED RINGERS IV BOLUS
1000.0000 mL | Freq: Once | INTRAVENOUS | Status: AC
  Administered 2020-01-14: 1000 mL via INTRAVENOUS

## 2020-01-14 MED ORDER — OXYCODONE-ACETAMINOPHEN 5-325 MG PO TABS
2.0000 | ORAL_TABLET | Freq: Once | ORAL | Status: AC
  Administered 2020-01-14: 2 via ORAL
  Filled 2020-01-14: qty 2

## 2020-01-14 MED ORDER — ADULT MULTIVITAMIN W/MINERALS CH
1.0000 | ORAL_TABLET | Freq: Every day | ORAL | Status: DC
  Administered 2020-01-15 – 2020-01-17 (×3): 1 via ORAL
  Filled 2020-01-14 (×3): qty 1

## 2020-01-14 MED ORDER — ENOXAPARIN SODIUM 40 MG/0.4ML ~~LOC~~ SOLN
40.0000 mg | SUBCUTANEOUS | Status: DC
  Administered 2020-01-16: 40 mg via SUBCUTANEOUS
  Filled 2020-01-14: qty 0.4

## 2020-01-14 MED ORDER — SODIUM CHLORIDE 0.9 % IV SOLN
1.0000 g | Freq: Once | INTRAVENOUS | Status: AC
  Administered 2020-01-14: 1 g via INTRAVENOUS
  Filled 2020-01-14: qty 10

## 2020-01-14 MED ORDER — IBUPROFEN 200 MG PO TABS
400.0000 mg | ORAL_TABLET | Freq: Four times a day (QID) | ORAL | Status: DC | PRN
  Filled 2020-01-14: qty 2

## 2020-01-14 MED ORDER — HYDROMORPHONE HCL 1 MG/ML IJ SOLN
1.0000 mg | Freq: Once | INTRAMUSCULAR | Status: AC
  Administered 2020-01-14: 1 mg via INTRAVENOUS
  Filled 2020-01-14: qty 1

## 2020-01-14 MED ORDER — SODIUM CHLORIDE 0.9 % IV SOLN: INTRAVENOUS | Status: DC

## 2020-01-14 MED ORDER — SODIUM CHLORIDE 0.9 % IV SOLN
1.0000 g | INTRAVENOUS | Status: DC
  Administered 2020-01-15: 1 g via INTRAVENOUS
  Filled 2020-01-14: qty 10
  Filled 2020-01-14: qty 1

## 2020-01-14 NOTE — H&P (Signed)
TRH H&P   Patient Demographics:    Crystalann Korf, is a 29 y.o. female  MRN: [id number]   DOB - 1991-05-24  Admit Date - 01/14/2020  Outpatient Primary MD for the patient is Patient, No Pcp Per  Referring MD/NP/PA: Dr Wilhemena Durie  Patient coming from: Home  Chief Complaint  Patient presents with  . Hand Swelling  . Joint Swelling      HPI:    Patient was seen with female chaperone throughout entire encounter, nurse tech Francoise Chojnowski  is a 29 y.o. female, without significant past medical history, she has 24-monthold kids, who she is breast-feeding, and presents with complaints of hand swelling, multiple joint swelling and pain started last Saturday (4 days ago), reports symptoms initially mild, but has been progressed, and it has been very peripheral over last 24 hours, she does report more severe pain in the left hand, but currently does exist as well in the left hand, bilateral elbow, and right shoulder, as well she does report bilateral knees, and it is difficult for her for weightbearing at this point secondary to bilateral knee pain, and yesterday she started to experience some pain in the lower back area as well, denies any focal deficits, tingling, numbness,, no headache, no stiff neck, no fever, but she does endorse some chills, photophobia, as well she does report some dysuria, she denies any history of smoking, substance abuse, or alcohol abuse, patient is married, monogamous with her husband, she does not suspect any sexually transmitted disease from her husband. - in ED was afebrile, no significant lab abnormalities, on GU exam by ED physician, she was noted to have some vaginal discharge, and cervical shin tenderness, she was started on IV Rocephin, and [facility] were consulted to admit possible disseminated gonococcal infection.    Review of systems:      In addition to the HPI above, He denies fever, but reports some chills No Headache, No changes with Vision or hearing, No problems swallowing food or Liquids, No Chest pain, Cough or Shortness of Breath, No Abdominal pain, No Nausea or Vommitting, Bowel movements are regular, No Blood in stool or Urine, No dysuria, Denies any skin rash, but noted to have very small in the left knee and right elbow. Does complain of significant polyarthralgia No new weakness, tingling, numbness in any extremity, No recent weight gain or loss, No polyuria, polydypsia or polyphagia, No significant Mental Stressors.  A full 10 point Review of Systems was done, except as stated above, all other Review of Systems were negative.   With Past History of the following :    History reviewed. No pertinent past medical history.    Past Surgical History:  Procedure Laterality Date  . NO PAST SURGERIES        Social History:     Social History   Tobacco Use  . Smoking  status: Never Smoker  . Smokeless tobacco: Never Used  Substance Use Topics  . Alcohol use: No        Family History :     Family History  Problem Relation Age of Onset  . Hypertension Mother   . Diabetes Sister   . Diabetes Maternal Grandmother   . Diabetes Maternal Uncle    She denies any history of rheumatoid arthritis or any autoimmune disease   Home Medications:   Prior to Admission medications   Medication Sig Start Date End Date Taking? Authorizing Provider  ibuprofen (ADVIL) 600 MG tablet Take 1 tablet (600 mg total) by mouth every 6 (six) hours. 04/10/19   Nicolette Bang, DO  Multiple Vitamins-Calcium (ONE-A-DAY WOMENS PO) Take by mouth.    [provider]  senna-docusate (SENOKOT-S) 8.6-50 MG tablet Take 2 tablets by mouth daily. 04/11/19   Nicolette Bang, DO     Allergies:    No Known Allergies   Physical Exam:   Vitals  Blood pressure 125/83, pulse 91, temperature  98.7 F (37.1 C), temperature source Oral, resp. rate 16, SpO2 100 %, unknown if currently breastfeeding.  Patient was seen with female chaperone throughout entire encounter, nurse tech Brilliant  1. General well developed female, laying in bed, in no apparent distress  2. Normal affect and insight, Not Suicidal or Homicidal, Awake Alert, Oriented X 3.  3. No F.N deficits, ALL C.Nerves Intact, Strength 5/5 all 4 extremities, Sensation intact all 4 extremities, Plantars down going.  4. Ears and Eyes appear Normal, Conjunctivae clear, PERRLA. Moist Oral Mucosa.  5. Supple Neck, No JVD, No cervical lymphadenopathy appriciated, No Carotid Bruits.  6. Symmetrical Chest wall movement, Good air movement bilaterally, CTAB.  7. RRR, No Gallops, Rubs or Murmurs, No Parasternal Heave.  8. Positive Bowel Sounds, Abdomen Soft,  No organomegaly appriciated,No rebound -guarding or rigidity.  9.  No Cyanosis, Normal Skin Turgor, and has very small rash and left knee and right elbow area.  10. Good muscle tone, intact significant tenderness in bilateral hands, elbows and knees, but swelling mainly noted in the bilateral hands, and erythema in the left hand.  11. No Palpable Lymph Nodes in Neck or Axillae  GU exam was deferred as it was done earlier by Dr. Vallery Ridge ED physician.          Data Review:    CBC Recent Labs  Lab 01/14/20 0846  WBC 9.2  HGB 12.6  HCT 38.8  PLT 217  MCV 89.4  MCH 29.0  MCHC 32.5  RDW 14.1  LYMPHSABS 1.9  MONOABS 0.8  EOSABS 0.2  BASOSABS 0.0   ------------------------------------------------------------------------------------------------------------------  Chemistries  Recent Labs  Lab 01/14/20 0846  NA 146*  K 4.0  CL 116*  CO2 22  GLUCOSE 100*  BUN 12  CREATININE 0.46  CALCIUM 8.6*  AST 56*  ALT 79*  ALKPHOS 99  BILITOT 0.5    ------------------------------------------------------------------------------------------------------------------ CrCl cannot be calculated (Unknown ideal weight.). ------------------------------------------------------------------------------------------------------------------ No results for input(s): TSH, T4TOTAL, T3FREE, THYROIDAB in the last 72 hours.  Invalid input(s): FREET3  Coagulation profile Recent Labs  Lab 01/14/20 0846  INR 1.1   ------------------------------------------------------------------------------------------------------------------- No results for input(s): DDIMER in the last 72 hours. -------------------------------------------------------------------------------------------------------------------  Cardiac Enzymes No results for input(s): CKMB, TROPONINI, MYOGLOBIN in the last 168 hours.  Invalid input(s): CK ------------------------------------------------------------------------------------------------------------------ No results found for: BNP   ---------------------------------------------------------------------------------------------------------------  Urinalysis    Component Value Date/Time   COLORURINE YELLOW 01/14/2020 1150  APPEARANCEUR CLEAR 01/14/2020 1150   LABSPEC 1.016 01/14/2020 1150   PHURINE 5.0 01/14/2020 1150   GLUCOSEU NEGATIVE 01/14/2020 1150   HGBUR SMALL (A) 01/14/2020 1150   BILIRUBINUR NEGATIVE 01/14/2020 1150   KETONESUR NEGATIVE 01/14/2020 1150   PROTEINUR NEGATIVE 01/14/2020 1150   NITRITE NEGATIVE 01/14/2020 1150   LEUKOCYTESUR TRACE (A) 01/14/2020 1150    ----------------------------------------------------------------------------------------------------------------   Imaging Results:    DG Chest Port 1 View  Result Date: 01/14/2020 CLINICAL DATA:  Left-sided chest pain. EXAM: PORTABLE CHEST 1 VIEW COMPARISON:  None. FINDINGS: The heart size and mediastinal contours are within normal limits. Both lungs  are clear. The visualized skeletal structures are unremarkable. IMPRESSION: No active disease. Electronically Signed   By: Monte Fantasia M.D.   On: 01/14/2020 08:49   DG Hand Complete Left  Result Date: 01/14/2020 CLINICAL DATA:  LEFT hand swelling and redness. No reported recent trauma. EXAM: LEFT HAND - COMPLETE 3+ VIEW COMPARISON:  None FINDINGS: No sign of fracture. Subtle widening of the space between the third and fourth metacarpal bases. No sign of frank subluxation or dislocation. Soft tissue swelling over the ulnar aspect of the hand along the fifth metacarpal. IMPRESSION: 1. Soft tissue swelling without sign of fracture. 2. Subtle widening of the space between the third and fourth metacarpal bases. Correlate with any point tenderness, while this may be rhythm the range of normal subtle ligamentous injury is considered. Electronically Signed   By: Zetta Bills M.D.   On: 01/14/2020 09:05      Assessment & Plan:    Active Problems:   Polyarthralgia   Polyarthritis   Acute lower UTI   Polyarthritis/polyarthralgia -Unclear etiology, infectious versus autoimmune, will obtain procalcitonin as if it is elevated, will make infectious etiology most likely. -Per ED physician exam, patient had some yellow vaginal discharge, no cervical motion tenderness, there was a concern about disseminated pneumococcal infection causing respiratory arthritis, as well she does have some small skin rash(please see pictures below), so for now we will continue with IV Rocephin 1 g every 24 hours during blood cultures and GC probe. -As well autoimmune is a possibility during Reiter's syndrome, will check HLA-B27, will check gluten antibody panel as well, will check antinuclear antibody, anti-CCP and rheumatoid factor as well to evaluate for moderate arthritis .  Acute UTI -Does report dysuria, continue with IV Rocephin for now, follow-up blood cultures and urine cultures.   Patient had 59-monthyear-old, who  she is breast-feeding, so I have told her to pump and dump while she is in the hospital.    DVT Prophylaxis Lovenox   AM Labs Ordered, also please review Full Orders  Family Communication: Admission, patients condition and plan of care including tests being ordered have been discussed with the patient  who indicate understanding and agree with the plan and Code Status.  Code Status Full  Likely DC to  Home  Condition GUARDED    Consults called: D/W ID via phone  Admission status: inpatient  Time spent in minutes : 50 minutes   DPhillips ClimesM.D on 01/14/2020 at 2:57 PM   [facility] - Office  [phone]

## 2020-01-14 NOTE — ED Provider Notes (Signed)
[facility]-EMERGENCY DEPT Provider Note   CSN: [id number] Arrival date & time: 01/14/20  6834     History Chief Complaint  Patient presents with  . Hand Swelling  . Joint Swelling    Mayleigh Tetrault is a 29 y.o. female.  HPI Patient is healthy with no past medical history.  She is a little under a year postpartum and breast-feeding.  4 days ago she started to get pain in the side of her left hand.  She reports initially it was not too severe but it became much more painful.  That day and the next day she also started to develop pain into the right hand.  Pain seems to be concentrated around the metacarpals and metacarpal phalangeal joints.  She developed a lot of redness and swelling over the left fifth metacarpal phalangeal joint.  She reports 2 days ago she started to develop pain in her knees as well.  She reports that the left knee is much more painful and is even difficult for weightbearing at this point.  He perceives some swelling but it has not appeared red.  It hurts to bend the left knee.  She reports as of yesterday today she is also not experiencing pain into her back.  She does not have any weakness numbness or tingling to the extremities.  No headache.  No neck stiffness or pain.  No fevers or chills.  No photophobia.  Patient has had some burning with urination for the past week.  She was trying an OTC medication without much relief.  She denies vaginal discharge or bleeding.  Patient reports she is hardly ever outside and does not think she is had any tick bites.  No rashes have developed.  She reports except for the fact that her joints hurt so badly, she does not feel sick.  Has tried ibuprofen at home without relief.  No Substance abuse history.  Non-smoker.  No regular alcohol consumption.  Patient is married.  Patient does not suspect sexually transmitted disease from husband.    History reviewed. No pertinent past medical history.  Patient  Active Problem List   Diagnosis Date Noted  . SVD (spontaneous vaginal delivery) 04/10/2019  . Indication for care in labor or delivery 04/08/2019    Past Surgical History:  Procedure Laterality Date  . NO PAST SURGERIES       OB History    Gravida  2   Para  2   Term  2   Preterm      AB      Living  2     SAB      TAB      Ectopic      Multiple  0   Live Births  2           Family History  Problem Relation Age of Onset  . Hypertension Mother   . Diabetes Sister   . Diabetes Maternal Grandmother   . Diabetes Maternal Uncle     Social History   Tobacco Use  . Smoking status: Never Smoker  . Smokeless tobacco: Never Used  Substance Use Topics  . Alcohol use: No  . Drug use: No    Home Medications Prior to Admission medications   Medication Sig Start Date End Date Taking? Authorizing Provider  ibuprofen (ADVIL) 600 MG tablet Take 1 tablet (600 mg total) by mouth every 6 (six) hours. 04/10/19   Arvilla Market, DO  Multiple Vitamins-Calcium (ONE-A-DAY WOMENS  PO) Take by mouth.    [provider]  senna-docusate (SENOKOT-S) 8.6-50 MG tablet Take 2 tablets by mouth daily. 04/11/19   Arvilla Market, DO    Allergies    Patient has no known allergies.  Review of Systems   Review of Systems 10 systems reviewed and negative except as per HPI. Physical Exam Updated Vital Signs BP 110/78   Pulse 67   Temp 98.7 F (37.1 C) (Oral)   Resp 16   SpO2 100%   Physical Exam Constitutional:      Comments: Alert and nontoxic.  Well-nourished well-developed.  Clinically healthy in appearance.  Patient is in significant pain.  HENT:     Head: Normocephalic and atraumatic.     Nose: Nose normal.     Mouth/Throat:     Mouth: Mucous membranes are moist.     Pharynx: Oropharynx is clear.     Comments: Posterior oropharynx widely patent.  No erythema or exudates or vesicles of the throat.  Geographic tongue but otherwise  normal. Eyes:     Extraocular Movements: Extraocular movements intact.     Conjunctiva/sclera: Conjunctivae normal.     Pupils: Pupils are equal, round, and reactive to light.  Cardiovascular:     Rate and Rhythm: Normal rate and regular rhythm.  Pulmonary:     Effort: Pulmonary effort is normal.     Breath sounds: Normal breath sounds.  Abdominal:     Comments: Is soft.  Mild lower abdominal discomfort to palpation left greater than right.  No guarding.  Genitourinary:    Comments: External genitalia normal.  Mild to moderate amount of whitish-yellow discharge in the vaginal vault.  Positive cervical motion tenderness.  No uterine enlargement.  Moderate diffuse tenderness of adnexa. Musculoskeletal:     Cervical back: Neck supple. No rigidity.     Comments: Patient has erythematous and swollen left metacarpophalangeal joint.  See attached images.  Significant pain with any range of motion of the digits 4 and 5 on the left.  No pain with range of motion of the digits on the right but no objective swelling.  Bilateral knees are painful but no objective erythema or effusion.  Significant pain with flexion of the left knee.  No peripheral edema.  Calves are soft and nontender.  Feet are in good condition no swelling, no effusions of the ankles or erythema of the toes of the joints.  Palms and soles examined and no rashes present.  Lymphadenopathy:     Cervical: No cervical adenopathy.  Skin:    General: Skin is warm and dry.     Comments: No rashes.  Patient does have a 1 very innocuous appearing erythematous papule on the medial aspect of the left knee.  This is about 3 mm and pinkish color with no vesicle or pustule.  No surrounding swelling.  Appearance is for a very minor bug bite.  No tick present and no bull's-eye rash.  Neurological:     General: No focal deficit present.     Mental Status: She is oriented to person, place, and time.     Cranial Nerves: No cranial nerve deficit.      Sensory: No sensory deficit.     Motor: No weakness.     Coordination: Coordination normal.     Comments: Speech is clear and normal.  Cognitive function normal.  Movements are coordinated purposeful symmetric except as some limitations by extremity pain.  Psychiatric:  Mood and Affect: Mood normal.         ED Results / Procedures / Treatments   Labs (all labs ordered are listed, but only abnormal results are displayed) Labs Reviewed  WET PREP, GENITAL - Abnormal; Notable for the following components:      Result Value   Clue Cells Wet Prep HPF POC PRESENT (*)    WBC, Wet Prep HPF POC FEW (*)    All other components within normal limits  COMPREHENSIVE METABOLIC PANEL - Abnormal; Notable for the following components:   Sodium 146 (*)    Chloride 116 (*)    Glucose, Bld 100 (*)    Calcium 8.6 (*)    Albumin 3.4 (*)    AST 56 (*)    ALT 79 (*)    All other components within normal limits  URINALYSIS, ROUTINE W REFLEX MICROSCOPIC - Abnormal; Notable for the following components:   Hgb urine dipstick SMALL (*)    Leukocytes,Ua TRACE (*)    Bacteria, UA FEW (*)    All other components within normal limits  SEDIMENTATION RATE - Abnormal; Notable for the following components:   Sed Rate 32 (*)    All other components within normal limits  SARS CORONAVIRUS 2 BY RT PCR (HOSPITAL ORDER, PERFORMED IN [facility])  CULTURE, BLOOD (ROUTINE X 2)  CULTURE, BLOOD (ROUTINE X 2)  URINE CULTURE  CBC WITH DIFFERENTIAL/PLATELET  PROTIME-INR  LACTIC ACID, PLASMA  HIV ANTIBODY (ROUTINE TESTING W REFLEX)  HCG, QUANTITATIVE, PREGNANCY  C-REACTIVE PROTEIN  RPR  RHEUMATOID FACTOR  ANTINUCLEAR ANTIBODIES, IFA  URIC ACID  LYME DISEASE DNA BY PCR([facility])  GC/CHLAMYDIA PROBE AMP ([place]) NOT AT [facility]    EKG None  Radiology DG Chest Port 1 View  Result Date: 01/14/2020 CLINICAL DATA:  Left-sided chest pain. EXAM: PORTABLE CHEST 1 VIEW COMPARISON:  None.  FINDINGS: The heart size and mediastinal contours are within normal limits. Both lungs are clear. The visualized skeletal structures are unremarkable. IMPRESSION: No active disease. Electronically Signed   By: Marnee Spring M.D.   On: 01/14/2020 08:49   DG Hand Complete Left  Result Date: 01/14/2020 CLINICAL DATA:  LEFT hand swelling and redness. No reported recent trauma. EXAM: LEFT HAND - COMPLETE 3+ VIEW COMPARISON:  None FINDINGS: No sign of fracture. Subtle widening of the space between the third and fourth metacarpal bases. No sign of frank subluxation or dislocation. Soft tissue swelling over the ulnar aspect of the hand along the fifth metacarpal. IMPRESSION: 1. Soft tissue swelling without sign of fracture. 2. Subtle widening of the space between the third and fourth metacarpal bases. Correlate with any point tenderness, while this may be rhythm the range of normal subtle ligamentous injury is considered. Electronically Signed   By: Donzetta Kohut M.D.   On: 01/14/2020 09:05    Procedures Procedures (including critical care time)  Medications Ordered in ED Medications  cefTRIAXone (ROCEPHIN) 1 g in sodium chloride 0.9 % 100 mL IVPB (has no administration in time range)  azithromycin (ZITHROMAX) 500 mg in sodium chloride 0.9 % 250 mL IVPB (has no administration in time range)  ketorolac (TORADOL) 30 MG/ML injection 30 mg (has no administration in time range)  ketorolac (TORADOL) 30 MG/ML injection 30 mg (30 mg Intravenous Given 01/14/20 0856)  HYDROmorphone (DILAUDID) injection 1 mg (1 mg Intravenous Given 01/14/20 0856)  lactated ringers bolus 1,000 mL (0 mLs Intravenous Stopped 01/14/20 1138)  oxyCODONE-acetaminophen (PERCOCET/ROXICET) 5-325 MG per tablet  2 tablet (2 tablets Oral Given 01/14/20 1355)    ED Course  I have reviewed the triage vital signs and the nursing notes.  Pertinent labs & imaging results that were available during my care of the patient were reviewed by me and  considered in my medical decision making (see chart for details).    MDM Rules/Calculators/A&P                     Consult: Reviewed with Dr. Griffin Basil orthopedics.  We discussed possible differential including gonococcal arthritides.  At this time he does not suggest attempting aspiration of knee for synovial fluid given lack of effusion.  Suspect low probability of obtaining any specimen.  Agrees with empiric treatment for disseminated GC first and continuing other work-up as indicated. Consult: Reviewed with Dr. Linus Salmons infectious disease.  Also agrees with for starting empiric treatment for GC.  Rocephin IV every 24 hours is preferable.  Also empiric coverage for chlamydia indicated.  Might also represent other post viral partly arthralgia.  Patient presents as outlined.  She is a healthy young adult without other medical problems.  At this time she has objective erythematous and swollen metacarpophalangeal joint and severe arthralgia of the knees.  Pain with the arthralgia of the knees is inhibiting normal gait.  Patient has difficulty with weightbearing secondary to pain.  At this time, no other associated symptoms.  Patient has not been having any fever generalized myalgia, headache, vomiting.  With combination of some dysuria for several days, pelvic tenderness and polyarthralgia will start treatment for disseminated GC while other diagnostic evaluation is being completed.  Final Clinical Impression(s) / ED Diagnoses Final diagnoses:  Polyarticular arthritis  Intractable pain  Acute pain of both knees    Rx / DC Orders ED Discharge Orders    None       Charlesetta Shanks, MD 01/14/20 1409

## 2020-01-14 NOTE — ED Triage Notes (Signed)
Pt c/o paroxysmal bilateral hands and knee swelling and increasing pain since Saturday. Pt denies trauma or recent changes in lifestyle.

## 2020-01-15 LAB — COMPREHENSIVE METABOLIC PANEL
ALT: 50 U/L — ABNORMAL HIGH (ref 0–44)
AST: 26 U/L (ref 15–41)
Albumin: 2.9 g/dL — ABNORMAL LOW (ref 3.5–5.0)
Alkaline Phosphatase: 91 U/L (ref 38–126)
Anion gap: 10 (ref 5–15)
BUN: 9 mg/dL (ref 6–20)
CO2: 23 mmol/L (ref 22–32)
Calcium: 8 mg/dL — ABNORMAL LOW (ref 8.9–10.3)
Chloride: 107 mmol/L (ref 98–111)
Creatinine, Ser: 0.5 mg/dL (ref 0.44–1.00)
GFR calc Af Amer: >60 mL/min (ref >60)
GFR calc non Af Amer: >60 mL/min (ref >60)
Glucose, Bld: 87 mg/dL (ref 70–99)
Potassium: 3.8 mmol/L (ref 3.5–5.1)
Sodium: 140 mmol/L (ref 135–145)
Total Bilirubin: 0.5 mg/dL (ref 0.3–1.2)
Total Protein: 5.7 g/dL — ABNORMAL LOW (ref 6.5–8.1)

## 2020-01-15 LAB — CBC
HCT: 34.9 % — ABNORMAL LOW (ref 36.0–46.0)
Hemoglobin: 11.2 g/dL — ABNORMAL LOW (ref 12.0–15.0)
MCH: 29.1 pg (ref 26.0–34.0)
MCHC: 32.1 g/dL (ref 30.0–36.0)
MCV: 90.6 fL (ref 80.0–100.0)
Platelets: 222 10*3/uL (ref 150–400)
RBC: 3.85 MIL/uL — ABNORMAL LOW (ref 3.87–5.11)
RDW: 14.2 % (ref 11.5–15.5)
WBC: 8.2 10*3/uL (ref 4.0–10.5)
nRBC: 0 % (ref 0.0–0.2)

## 2020-01-15 LAB — GC/CHLAMYDIA PROBE AMP (~~LOC~~) NOT AT ARMC
Chlamydia: NEGATIVE
Comment: NEGATIVE
Comment: NORMAL
Neisseria Gonorrhea: POSITIVE — AB

## 2020-01-15 LAB — URINE CULTURE: Culture: NO GROWTH

## 2020-01-15 LAB — HEPATITIS PANEL, ACUTE
HCV Ab: NONREACTIVE
Hep A IgM: NONREACTIVE
Hep B C IgM: NONREACTIVE
Hepatitis B Surface Ag: NONREACTIVE

## 2020-01-15 LAB — C-REACTIVE PROTEIN: CRP: 12.6 mg/dL — ABNORMAL HIGH (ref <1.0)

## 2020-01-15 LAB — PROCALCITONIN: Procalcitonin: <0.1 ng/mL

## 2020-01-15 MED ORDER — PREDNISONE 20 MG PO TABS
40.0000 mg | ORAL_TABLET | Freq: Every day | ORAL | Status: DC
  Administered 2020-01-15 – 2020-01-16 (×2): 40 mg via ORAL
  Filled 2020-01-15 (×2): qty 2

## 2020-01-15 NOTE — Progress Notes (Signed)
PROGRESS NOTE    Candice Davis  UPB:[id number] DOB: Dec 10, 1990 DOA: 01/14/2020 PCP: Patient, No Pcp Per   Brief Narrative:  Candice Davis  is a 29 y.o. female, without significant past medical history, she has 60-monthold kids, who she is breast-feeding, and presents with complaints of hand swelling, multiple joint swelling and pain started last Saturday (4 days ago), reports symptoms initially mild, but has been progressed, and it has been very peripheral over last 24 hours, she does report more severe pain in the left hand, but currently does exist as well in the left hand, bilateral elbow, and right shoulder, as well she does report bilateral knees, and it is difficult for her for weightbearing at this point secondary to bilateral knee pain, and yesterday she started to experience some pain in the lower back area as well, denies any focal deficits, tingling, numbness,, no headache, no stiff neck, no fever, but she does endorse some chills, photophobia, as well she does report some dysuria, she denies any history of smoking, substance abuse, or alcohol abuse, patient is married, monogamous with her husband, she does not suspect any sexually transmitted disease from her husband. - in ED was afebrile, no significant lab abnormalities, on GU exam by ED physician, she was noted to have some vaginal discharge, and cervical shin tenderness, she was started on IV Rocephin, and [facility] were consulted to admit possible disseminated gonococcal infection.  Will start on prednisone 40 mg p.o. daily in the meantime.   Assessment & Plan:   Active Problems:   Polyarthralgia   Polyarthritis   Acute lower UTI  Polyarthritis/polyarthralgia: Etiology so far unclear.  Could be infectious versus autoimmune.  ESR elevated.  Patient had vaginal discharge but raises suspicion for possible disseminated pneumococcal infection.  Patient's main complaint is pain and tenderness in bilateral hands and somewhat  in the left knee.  This also raises suspicion for possible rheumatoid arthritis.  RA has been ordered.  I will order anti-CCP.  Will order more labs such as ANA with reflex, anticentromere, anti-Smith antibody, GC chlamydia pending.  She has been tested negative for HIV and acute viral hepatitis panel.  Will consult PT OT   UTI: She complained of dysuria however her UA is not impressive enough but she was started on Rocephin which I will continue and tailor antibiotics based on the culture which is negative so far.  DVT prophylaxis: Lovenox   Code Status: Full Code  Family Communication:  None present at bedside.  Plan of care discussed with patient in length and he verbalized understanding and agreed with it. Patient is from: Home Disposition Plan: To be determined based on PT assessment Barriers to discharge: Excruciating pain and inability to walk  Status is: Inpatient  Remains inpatient appropriate because:Inpatient level of care appropriate due to severity of illness   Dispo: The patient is from: Home              Anticipated d/c is to: Home              Anticipated d/c date is: 2 days              Patient currently is not medically stable to d/c.         Estimated body mass index is 30.64 kg/m as calculated from the following:   Height as of 04/08/19: 4' 10"  (1.473 m).   Weight as of this encounter: 66.5 kg.      Nutritional status:  Consultants:   None  Procedures:   None  Antimicrobials:  Anti-infectives (From admission, onward)   Start     Dose/Rate Route Frequency Ordered Stop   01/15/20 1400  cefTRIAXone (ROCEPHIN) 1 g in sodium chloride 0.9 % 100 mL IVPB     1 g 200 mL/hr over 30 Minutes Intravenous Every 24 hours 01/14/20 1457     01/14/20 1415  cefTRIAXone (ROCEPHIN) 1 g in sodium chloride 0.9 % 100 mL IVPB     1 g 200 mL/hr over 30 Minutes Intravenous  Once 01/14/20 1400 01/14/20 1447   01/14/20 1415  azithromycin  (ZITHROMAX) 500 mg in sodium chloride 0.9 % 250 mL IVPB     500 mg 250 mL/hr over 60 Minutes Intravenous  Once 01/14/20 1400 01/15/20 0303         Subjective: Patient seen and examined.  She complains of pain in bilateral hands, mostly on the lateral side.  She also complains of pain in bilateral knees, much more in the left knee.  No elbow pain or any other joint pain.  Objective: Vitals:   01/14/20 2105 01/15/20 0047 01/15/20 0410 01/15/20 1325  BP: 113/73 107/70 113/73 105/68  Pulse: 85 71 71 67  Resp: 16 14 14 18   Temp: 99 F (37.2 C) 98.1 F (36.7 C) 98.3 F (36.8 C) 98.2 F (36.8 C)  TempSrc: Oral Oral Oral Oral  SpO2: 98% 97% 97%   Weight:        Intake/Output Summary (Last 24 hours) at 01/15/2020 1337 Last data filed at 01/15/2020 2751 Gross per 24 hour  Intake 1906.73 ml  Output --  Net 1906.73 ml   Filed Weights   01/14/20 1603  Weight: 66.5 kg    Examination:  General exam: Appears calm and comfortable  Respiratory system: Clear to auscultation. Respiratory effort normal. Cardiovascular system: S1 & S2 heard, RRR. No JVD, murmurs, rubs, gallops or clicks. No pedal edema. Gastrointestinal system: Abdomen is nondistended, soft and nontender. No organomegaly or masses felt. Normal bowel sounds heard. Central nervous system: Alert and oriented. No focal neurological deficits. Extremities: Symmetric 5 x 5 power in both upper extremities.  Limited range of motion in the left knee.  Limited range of motion of bilateral hand.  Hands are tender at the carpometacarpal joint and phalangeal joints mostly on the lateral side. Skin: No rashes, lesions or ulcers Psychiatry: Judgement and insight appear normal. Mood & affect appropriate.   Data Reviewed: I have personally reviewed following labs and imaging studies  CBC: Recent Labs  Lab 01/14/20 0846 01/15/20 0524  WBC 9.2 8.2  NEUTROABS 6.3  --   HGB 12.6 11.2*  HCT 38.8 34.9*  MCV 89.4 90.6  PLT 217 700    Basic Metabolic Panel: Recent Labs  Lab 01/14/20 0846 01/15/20 0524  NA 146* 140  K 4.0 3.8  CL 116* 107  CO2 22 23  GLUCOSE 100* 87  BUN 12 9  CREATININE 0.46 0.50  CALCIUM 8.6* 8.0*   GFR: CrCl cannot be calculated (Unknown ideal weight.). Liver Function Tests: Recent Labs  Lab 01/14/20 0846 01/15/20 0524  AST 56* 26  ALT 79* 50*  ALKPHOS 99 91  BILITOT 0.5 0.5  PROT 6.6 5.7*  ALBUMIN 3.4* 2.9*   No results for input(s): LIPASE, AMYLASE in the last 168 hours. No results for input(s): AMMONIA in the last 168 hours. Coagulation Profile: Recent Labs  Lab 01/14/20 0846  INR 1.1   Cardiac Enzymes: No results for  input(s): CKTOTAL, CKMB, CKMBINDEX, TROPONINI in the last 168 hours. BNP (last 3 results) No results for input(s): PROBNP in the last 8760 hours. HbA1C: No results for input(s): HGBA1C in the last 72 hours. CBG: No results for input(s): GLUCAP in the last 168 hours. Lipid Profile: No results for input(s): CHOL, HDL, LDLCALC, TRIG, CHOLHDL, LDLDIRECT in the last 72 hours. Thyroid Function Tests: No results for input(s): TSH, T4TOTAL, FREET4, T3FREE, THYROIDAB in the last 72 hours. Anemia Panel: No results for input(s): VITAMINB12, FOLATE, FERRITIN, TIBC, IRON, RETICCTPCT in the last 72 hours. Sepsis Labs: Recent Labs  Lab 01/14/20 0846  LATICACIDVEN 0.7    Recent Results (from the past 240 hour(s))  SARS Coronavirus 2 by RT PCR (hospital order, performed in [facility] hospital lab) Nasopharyngeal Nasopharyngeal Swab     Status: None   Collection Time: 01/14/20  8:33 AM   Specimen: Nasopharyngeal Swab  Result Value Ref Range Status   SARS Coronavirus 2 NEGATIVE NEGATIVE Final    Comment: (NOTE) SARS-CoV-2 target nucleic acids are NOT DETECTED. The SARS-CoV-2 RNA is generally detectable in upper and lower respiratory specimens during the acute phase of infection. The lowest concentration of SARS-CoV-2 viral copies this assay can detect is  250 copies / mL. A negative result does not preclude SARS-CoV-2 infection and should not be used as the sole basis for treatment or other patient management decisions.  A negative result may occur with improper specimen collection / handling, submission of specimen other than nasopharyngeal swab, presence of viral mutation(s) within the areas targeted by this assay, and inadequate number of viral copies (<250 copies / mL). A negative result must be combined with clinical observations, patient history, and epidemiological information. Fact Sheet for Patients:   StrictlyIdeas.no Fact Sheet for Healthcare Providers: BankingDealers.co.za This test is not yet approved or cleared  by the [place] FDA and has been authorized for detection and/or diagnosis of SARS-CoV-2 by FDA under an Emergency Use Authorization (EUA).  This EUA will remain in effect (meaning this test can be used) for the duration of the COVID-19 declaration under Section 564(b)(1) of the Act, 21 U.S.C. section 360bbb-3(b)(1), unless the authorization is terminated or revoked sooner. Performed at [facility], [place] [address] [place], [place] [address]   Culture, blood (routine x 2)     Status: None (Preliminary result)   Collection Time: 01/14/20  8:46 AM   Specimen: Left Antecubital; Blood  Result Value Ref Range Status   Specimen Description   Final    LEFT ANTECUBITAL Performed at [place] [address], [place], [place] [address]    Special Requests   Final    BOTTLES DRAWN AEROBIC AND ANAEROBIC Blood Culture adequate volume Performed at [place] [address] [place], [place] [address]    Culture   Final    NO GROWTH < 24 HOURS Performed at [place] [address], [place], [place] [address]    Report Status PENDING  Incomplete  Culture, blood (routine x 2)     Status: None  (Preliminary result)   Collection Time: 01/14/20  9:01 AM   Specimen: Right Antecubital; Blood  Result Value Ref Range Status   Specimen Description   Final    RIGHT ANTECUBITAL Performed at [place] [address] [place],  [address]    Special Requests   Final    BOTTLES DRAWN AEROBIC AND ANAEROBIC Blood Culture adequate volume Performed  at [facility], [place] [address], [place], [place] [address]    Culture   Final    NO GROWTH < 24 HOURS Performed at [place] [address], [place], [place] [address]    Report Status PENDING  Incomplete  Wet prep, genital     Status: Abnormal   Collection Time: 01/14/20 11:45 AM   Specimen: Cervix  Result Value Ref Range Status   Yeast Wet Prep HPF POC NONE SEEN NONE SEEN Final   Trich, Wet Prep NONE SEEN NONE SEEN Final   Clue Cells Wet Prep HPF POC PRESENT (A) NONE SEEN Final   WBC, Wet Prep HPF POC FEW (A) NONE SEEN Final   Sperm NONE SEEN  Final    Comment: Performed at [facility], [place] [address] [place], [place] [address]  Urine culture     Status: None   Collection Time: 01/14/20 11:50 AM   Specimen: Urine, Clean Catch  Result Value Ref Range Status   Specimen Description   Final    URINE, CLEAN CATCH Performed at [facility], [place] [address] [place], [place] [address]    Special Requests   Final    NONE Performed at [facility], [place] [address], [place], [place] [address]    Culture   Final    NO GROWTH Performed at [facility], [place] [address] [place], [place] [address]    Report Status 01/15/2020 FINAL  Final      Radiology Studies: DG Chest Port 1 View  Result Date: 01/14/2020 CLINICAL DATA:  Left-sided chest pain. EXAM: PORTABLE CHEST 1 VIEW COMPARISON:  None. FINDINGS: The heart size and mediastinal contours are within normal limits. Both lungs are clear. The visualized skeletal structures  are unremarkable. IMPRESSION: No active disease. Electronically Signed   By: Monte Fantasia M.D.   On: 01/14/2020 08:49   DG Hand Complete Left  Result Date: 01/14/2020 CLINICAL DATA:  LEFT hand swelling and redness. No reported recent trauma. EXAM: LEFT HAND - COMPLETE 3+ VIEW COMPARISON:  None FINDINGS: No sign of fracture. Subtle widening of the space between the third and fourth metacarpal bases. No sign of frank subluxation or dislocation. Soft tissue swelling over the ulnar aspect of the hand along the fifth metacarpal. IMPRESSION: 1. Soft tissue swelling without sign of fracture. 2. Subtle widening of the space between the third and fourth metacarpal bases. Correlate with any point tenderness, while this may be rhythm the range of normal subtle ligamentous injury is considered. Electronically Signed   By: Zetta Bills M.D.   On: 01/14/2020 09:05    Scheduled Meds: . enoxaparin (LOVENOX) injection  40 mg Subcutaneous Q24H  . multivitamin with minerals  1 tablet Oral Daily   Continuous Infusions: . sodium chloride Stopped (01/15/20 0614)  . cefTRIAXone (ROCEPHIN)  IV       LOS: 1 day   Time spent: 37 minutes   Darliss Cheney, MD [facility]  01/15/2020, 1:37 PM   To contact the attending provider between 7A-7P or the covering provider during after hours 7P-7A, please log into the web site [email or web address].

## 2020-01-16 DIAGNOSIS — A5400 Gonococcal infection of lower genitourinary tract, unspecified: Secondary | ICD-10-CM

## 2020-01-16 LAB — COMPREHENSIVE METABOLIC PANEL
ALT: 71 U/L — ABNORMAL HIGH (ref 0–44)
AST: 48 U/L — ABNORMAL HIGH (ref 15–41)
Albumin: 3.2 g/dL — ABNORMAL LOW (ref 3.5–5.0)
Alkaline Phosphatase: 109 U/L (ref 38–126)
Anion gap: 8 (ref 5–15)
BUN: 9 mg/dL (ref 6–20)
CO2: 24 mmol/L (ref 22–32)
Calcium: 9 mg/dL (ref 8.9–10.3)
Chloride: 109 mmol/L (ref 98–111)
Creatinine, Ser: 0.41 mg/dL — ABNORMAL LOW (ref 0.44–1.00)
GFR calc Af Amer: >60 mL/min (ref >60)
GFR calc non Af Amer: >60 mL/min (ref >60)
Glucose, Bld: 134 mg/dL — ABNORMAL HIGH (ref 70–99)
Potassium: 4.4 mmol/L (ref 3.5–5.1)
Sodium: 141 mmol/L (ref 135–145)
Total Bilirubin: 0.5 mg/dL (ref 0.3–1.2)
Total Protein: 6.8 g/dL (ref 6.5–8.1)

## 2020-01-16 LAB — CBC WITH DIFFERENTIAL/PLATELET
Abs Immature Granulocytes: 0.03 10*3/uL (ref 0.00–0.07)
Basophils Absolute: 0 10*3/uL (ref 0.0–0.1)
Basophils Relative: 0 %
Eosinophils Absolute: 0 10*3/uL (ref 0.0–0.5)
Eosinophils Relative: 0 %
HCT: 37.7 % (ref 36.0–46.0)
Hemoglobin: 12.2 g/dL (ref 12.0–15.0)
Immature Granulocytes: 0 %
Lymphocytes Relative: 15 %
Lymphs Abs: 1.2 10*3/uL (ref 0.7–4.0)
MCH: 28.8 pg (ref 26.0–34.0)
MCHC: 32.4 g/dL (ref 30.0–36.0)
MCV: 88.9 fL (ref 80.0–100.0)
Monocytes Absolute: 0.5 10*3/uL (ref 0.1–1.0)
Monocytes Relative: 5 %
Neutro Abs: 6.8 10*3/uL (ref 1.7–7.7)
Neutrophils Relative %: 80 %
Platelets: 262 10*3/uL (ref 150–400)
RBC: 4.24 MIL/uL (ref 3.87–5.11)
RDW: 13.3 % (ref 11.5–15.5)
WBC: 8.5 10*3/uL (ref 4.0–10.5)
nRBC: 0 % (ref 0.0–0.2)

## 2020-01-16 LAB — ANTI-SMITH ANTIBODY: ENA SM Ab Ser-aCnc: <0.2 AI (ref 0.0–0.9)

## 2020-01-16 LAB — ANA W/REFLEX IF POSITIVE: Anti Nuclear Antibody (ANA): NEGATIVE

## 2020-01-16 LAB — ANTINUCLEAR ANTIBODIES, IFA: ANA Ab, IFA: NEGATIVE

## 2020-01-16 LAB — CENTROMERE ANTIBODIES: Centromere Ab Screen: <0.2 AI (ref 0.0–0.9)

## 2020-01-16 LAB — RHEUMATOID FACTOR: Rheumatoid fact SerPl-aCnc: 13.7 IU/mL (ref 0.0–13.9)

## 2020-01-16 MED ORDER — SODIUM CHLORIDE 0.9 % IV SOLN
1.0000 g | INTRAVENOUS | Status: DC
  Administered 2020-01-16: 1 g via INTRAVENOUS
  Filled 2020-01-16: qty 10
  Filled 2020-01-16: qty 1
  Filled 2020-01-16: qty 10

## 2020-01-16 NOTE — Evaluation (Addendum)
Physical Therapy Evaluation Patient Details Name: Candice Davis MRN: [id number] DOB: 1991-01-20 Today's Date: 01/16/2020   History of Present Illness  29 y.o. female, without significant past medical history, she has 101-month-old kids, who she is breast-feeding, and presents with complaints of hand swelling, multiple joint swelling and pain started last Saturday (01/12/20), reports symptoms initially mild, but has been progressed. Workup in progress.  Clinical Impression  Pt admitted with above diagnosis. Pt ambulated 20' holding IV pole for support, distance limited by 2/10 L knee. At baseline, she is independent with mobility. Pt has decreased L knee flexion AROM (~100*) 2* pain. However,  her primary deficits and source of pain are her hands, L more so than R, see OT note for UE assessment. Pt is concerned about her ability to care for her 27 month old baby, who is over 30 pounds. Her spouse works out of town during the week, she does not have other assistance available.  Pt currently with functional limitations due to the deficits listed below (see PT Problem List). Pt will benefit from skilled PT to increase their independence and safety with mobility to allow discharge to the venue listed below.       Follow Up Recommendations Outpatient PT(possibly OPPT, depending on progress and medical work up)    Neurosurgeon for [employer]       Precautions / Restrictions Precautions Precautions: None Restrictions Weight Bearing Restrictions: No      Mobility  Bed Mobility Overal bed mobility: Independent                Transfers Overall transfer level: Independent                  Ambulation/Gait Ambulation/Gait assistance: Modified independent (Device/Increase time) Gait Distance (Feet): 70 Feet Assistive device: IV Pole Gait Pattern/deviations: Step-through pattern;Decreased stride length Gait velocity: decr   General  Gait Details: no loss of balance, 2/10 L knee pain with ambulation, pt felt more steady and preferred holding IV pole rather than walking without AD  Stairs            Wheelchair Mobility    Modified Rankin (Stroke Patients Only)       Balance Overall balance assessment: Modified Independent                                           Pertinent Vitals/Pain Pain Assessment: 0-10 Pain Score: 2  Pain Location: L knee with walking and with knee flexion Pain Descriptors / Indicators: Grimacing;Guarding;Sore Pain Intervention(s): Limited activity within patient's tolerance;Monitored during session;Repositioned    Home Living Family/patient expects to be discharged to:: Private residence Living Arrangements: Spouse/significant other;Children Available Help at Discharge: Family;Available PRN/intermittently Type of Home: Apartment Home Access: Stairs to enter   Entrance Stairs-Number of Steps: flight Home Layout: One level Home Equipment: None      Prior Function Level of Independence: Independent         Comments: has 12 month old baby (who is over 30 pounds), and a 36 year old daughter. Husband works out of town during the week and is home on weekends. Mother is local but works, sister is local but has 41 month old baby.     Hand Dominance   Dominant Hand: Left    Extremity/Trunk Assessment   Upper Extremity Assessment Upper Extremity Assessment: Defer  to OT evaluation    Lower Extremity Assessment Lower Extremity Assessment: LLE deficits/detail LLE Deficits / Details: knee AROM ~10-100*, flexion ROM limited by pain medial knee LLE Sensation: WNL LLE Coordination: WNL    Cervical / Trunk Assessment Cervical / Trunk Assessment: Normal  Communication   Communication: No difficulties(communicates well in [place]; spanish is primary language)  Cognition Arousal/Alertness: Awake/alert Behavior During Therapy: WFL for tasks  assessed/performed Overall Cognitive Status: Within Functional Limits for tasks assessed                                        General Comments      Exercises     Assessment/Plan    PT Assessment Patient needs continued PT services  PT Problem List Decreased range of motion;Decreased activity tolerance;Pain;Decreased mobility       PT Treatment Interventions Therapeutic exercise;Gait training;Stair training;Functional mobility training    PT Goals (Current goals can be found in the Care Plan section)  Acute Rehab PT Goals Patient Stated Goal: to be able to care for her 76 month old baby PT Goal Formulation: With patient Time For Goal Achievement: 01/30/20 Potential to Achieve Goals: Good    Frequency Min 3X/week   Barriers to discharge        Co-evaluation PT/OT/SLP Co-Evaluation/Treatment: Yes Reason for Co-Treatment: To address functional/ADL transfers PT goals addressed during session: Mobility/safety with mobility;Balance;Strengthening/ROM         AM-PAC PT "6 Clicks" Mobility  Outcome Measure Help needed turning from your back to your side while in a flat bed without using bedrails?: None Help needed moving from lying on your back to sitting on the side of a flat bed without using bedrails?: None Help needed moving to and from a bed to a chair (including a wheelchair)?: None Help needed standing up from a chair using your arms (e.g., wheelchair or bedside chair)?: None Help needed to walk in hospital room?: None Help needed climbing 3-5 steps with a railing? : A Little 6 Click Score: 23    End of Session Equipment Utilized During Treatment: Gait belt Activity Tolerance: Patient limited by pain Patient left: in chair;with call bell/phone within reach Nurse Communication: Mobility status PT Visit Diagnosis: Difficulty in walking, not elsewhere classified (R26.2);Pain Pain - Right/Left: Left Pain - part of body: Knee    Time:  1022-1049 PT Time Calculation (min) (ACUTE ONLY): 27 min   Charges:   PT Evaluation $PT Eval Low Complexity: 1 Low         Ralene Bathe Kistler PT 01/16/2020  Acute Rehabilitation Services Pager [phone] Office [phone]

## 2020-01-16 NOTE — Evaluation (Signed)
Occupational Therapy Evaluation Patient Details Name: Candice Davis MRN: [id number] DOB: 1991-04-23 Today's Date: 01/16/2020    History of Present Illness 29 y.o. female, without significant past medical history, she has 42-month-old kids, who she is breast-feeding, and presents with complaints of hand swelling, multiple joint swelling and pain started last Saturday (01/12/20), reports symptoms initially mild, but has been progressed. Workup in progress.   Clinical Impression   [name] is a 29 year old woman who presents with decreased ROM and strength of bilateral hands secondary to joint pain and swelling. Patient is able to perform basic ADLs but needs set up assistance due to limited grip/pinch strength at this time. Patient has concerns in regards to returning home and being able to take take of her children. Patient will benefit from skilled OT services while in hospital to improve deficits and learn compensatory strategies as needed in order to return home at discharge.     Follow Up Recommendations  No OT follow up    Equipment Recommendations  (built up handle for silverware)    Recommendations for Other Services       Precautions / Restrictions Precautions Precautions: None Restrictions Weight Bearing Restrictions: No      Mobility Bed Mobility Overal bed mobility: Independent                Transfers Overall transfer level: Independent               General transfer comment: Patient ambulatingin room independently. Feels more steady holding onto IV pole.    Balance Overall balance assessment: Modified Independent                                         ADL either performed or assessed with clinical judgement   ADL                                         General ADL Comments: Patient able to perform ADLs slowly and predominantly using a pincer grasp with thumb and index finger bilaterally  to perform tasks. Reports being able to perform toileting, demonstrates ability to donn socks and able to feed herself. Decreased grip strength.     Vision   Vision Assessment?: No apparent visual deficits     Perception     Praxis      Pertinent Vitals/Pain Pain Assessment: Faces Pain Score: 2  Faces Pain Scale: Hurts whole lot Pain Location: Pain in bilateral hands, L > R Pain Descriptors / Indicators: Grimacing;Guarding;Sore Pain Intervention(s): Limited activity within patient's tolerance;Monitored during session;Repositioned     Hand Dominance Left   Extremity/Trunk Assessment Upper Extremity Assessment Upper Extremity Assessment: RUE deficits/detail;LUE deficits/detail RUE Deficits / Details: Decreased finger flexion secondary to pain. Repors most pain in 2nd and 3rd digit. RUE: Unable to fully assess due to pain LUE Deficits / Details: Decreased finger flexion , and extensor lag in 5th digit secondary to pain. Left hand swollen and read LUE: Unable to fully assess due to pain   Lower Extremity Assessment Lower Extremity Assessment: LLE deficits/detail LLE Deficits / Details: knee AROM ~10-100*, flexion ROM limited by pain medial knee LLE Sensation: WNL LLE Coordination: WNL   Cervical / Trunk Assessment Cervical / Trunk Assessment: Normal   Communication Communication Communication: No difficulties(communicates  well in [place]; spanish is primary language)   Cognition Arousal/Alertness: Awake/alert Behavior During Therapy: WFL for tasks assessed/performed Overall Cognitive Status: Within Functional Limits for tasks assessed                                     General Comments       Exercises     Shoulder Instructions      Home Living Family/patient expects to be discharged to:: Private residence Living Arrangements: Spouse/significant other;Children Available Help at Discharge: Family;Available PRN/intermittently Type of Home:  Apartment Home Access: Stairs to enter Entrance Stairs-Number of Steps: flight   Home Layout: One level               Home Equipment: None          Prior Functioning/Environment Level of Independence: Independent        Comments: has 67 month old baby (who is over 30 pounds), and a 25 year old daughter. Husband works out of town during the week and is home on weekends. Mother is local but works, sister is local but has 35 month old baby.        OT Problem List: Decreased range of motion;Decreased strength;Pain;Impaired UE functional use      OT Treatment/Interventions: Therapeutic exercise;Patient/family education;Therapeutic activities;Manual therapy    OT Goals(Current goals can be found in the care plan section) Acute Rehab OT Goals Patient Stated Goal: to be able to care for her 44 month old baby OT Goal Formulation: With patient Time For Goal Achievement: 01/30/20 Potential to Achieve Goals: Fair  OT Frequency: Min 2X/week   Barriers to D/C: Decreased caregiver support          Co-evaluation   Reason for Co-Treatment: To address functional/ADL transfers PT goals addressed during session: Mobility/safety with mobility;Balance;Strengthening/ROM        AM-PAC OT "6 Clicks" Daily Activity     Outcome Measure Help from another person eating meals?: A Little Help from another person taking care of personal grooming?: A Little Help from another person toileting, which includes using toliet, bedpan, or urinal?: None Help from another person bathing (including washing, rinsing, drying)?: A Little Help from another person to put on and taking off regular upper body clothing?: None Help from another person to put on and taking off regular lower body clothing?: None 6 Click Score: 21   End of Session Equipment Utilized During Treatment: Gait belt Nurse Communication: Mobility status  Activity Tolerance: Patient limited by pain Patient left: in chair;with call  bell/phone within reach  OT Visit Diagnosis: Pain Pain - Right/Left: Left(both left and right) Pain - part of body: Hand                Time: 2542-7062 OT Time Calculation (min): 26 min Charges:  OT General Charges $OT Visit: 1 Visit OT Evaluation $OT Eval Low Complexity: 1 Low  Shakia Sebastiano, OTR/L Acute Care Rehab Services  Office [phone]   Kelli Churn 01/16/2020, 12:10 PM

## 2020-01-16 NOTE — Progress Notes (Signed)
PROGRESS NOTE    Candice Davis  PNT:[id number] DOB: 1991/05/03 DOA: 01/14/2020 PCP: Patient, No Pcp Per   Brief Narrative:  Candice Davis  is a 29 y.o. female, without significant past medical history, she has 74-monthold kids, who she is breast-feeding, and presents with complaints of hand swelling, multiple joint swelling and pain started last Saturday (4 days ago), reports symptoms initially mild, but has been progressed, and it has been very peripheral over last 24 hours, she does report more severe pain in the left hand, but currently does exist as well in the left hand, bilateral elbow, and right shoulder, as well she does report bilateral knees, and it is difficult for her for weightbearing at this point secondary to bilateral knee pain, and yesterday she started to experience some pain in the lower back area as well, denies any focal deficits, tingling, numbness,, no headache, no stiff neck, no fever, but she does endorse some chills, photophobia, as well she does report some dysuria, she denies any history of smoking, substance abuse, or alcohol abuse, patient is married, monogamous with her husband, she does not suspect any sexually transmitted disease from her husband. - in ED was afebrile, no significant lab abnormalities, on GU exam by ED physician, she was noted to have some vaginal discharge, and cervical shin tenderness, she was started on IV Rocephin, and [facility] were consulted to admit possible disseminated gonococcal infection.  Will start on prednisone 40 mg p.o. daily in the meantime.   Assessment & Plan:   Active Problems:   Polyarthralgia   Polyarthritis   Acute lower UTI  Polyarthritis/polyarthralgia: Etiology so far unclear but she undoubtedly positive with gonorrhea on her vaginal swab.  Patient has been receiving Rocephin and was also started on prednisone yesterday.  Patient's pain is improved and so is her tenderness.  It is challenging to say whether  this is autoimmune process or disseminated gonococcal infection as she was on both prednisone and Rocephin.  I had a long discussion with ID physician on call Dr. CLinus Salmons who recommended to treat as gonococcal infection with 7 days of IV Rocephin.  So far she has elevated ESR and CRP but negative procalcitonin.  Rheumatoid factor is negative as well and anti-CCP is pending.  I will hold her prednisone to see if she improves further on Rocephin only.  If anti-CCP is negative then this is likely gonococcal infection.  I have also informed about this to the patient and have advised her to have her partner get checked and get Rocephin as well.  She verbalized understanding.  PT OT is going to see her.  UTI: UA was not impressive enough and urine culture is negative.  I do not think she had UTI.  DVT prophylaxis: Lovenox   Code Status: Full Code  Family Communication:  None present at bedside.  Plan of care discussed with patient in length and he verbalized understanding and agreed with it. Patient is from: Home Disposition Plan: To be determined based on PT assessment Barriers to discharge: Excruciating pain and inability to walk  Status is: Inpatient  Remains inpatient appropriate because:Inpatient level of care appropriate due to severity of illness   Dispo: The patient is from: Home              Anticipated d/c is to: Home              Anticipated d/c date is: 1 day  Patient currently is not medically stable to d/c.         Estimated body mass index is 30.64 kg/m as calculated from the following:   Height as of 04/08/19: _0  (1.473 m).   Weight as of this encounter: 66.5 kg.      Nutritional status:               Consultants:   None  Procedures:   None  Antimicrobials:  Anti-infectives (From admission, onward)   Start     Dose/Rate Route Frequency Ordered Stop   01/15/20 1400  cefTRIAXone (ROCEPHIN) 1 g in sodium chloride 0.9 % 100 mL IVPB   Status:  Discontinued     1 g 200 mL/hr over 30 Minutes Intravenous Every 24 hours 01/14/20 1457 01/16/20 0918   01/14/20 1415  cefTRIAXone (ROCEPHIN) 1 g in sodium chloride 0.9 % 100 mL IVPB     1 g 200 mL/hr over 30 Minutes Intravenous  Once 01/14/20 1400 01/14/20 1447   01/14/20 1415  azithromycin (ZITHROMAX) 500 mg in sodium chloride 0.9 % 250 mL IVPB     500 mg 250 mL/hr over 60 Minutes Intravenous  Once 01/14/20 1400 01/15/20 0303         Subjective: Patient seen and examined.  Overall feels better.  Pain in the left hand and right hand has improved.  Left knee pain and range of motion has improved as well.  No other complaint.  Objective: Vitals:   01/15/20 0410 01/15/20 1325 01/15/20 2049 01/16/20 0530  BP: 113/73 105/68 109/77 111/74  Pulse: 71 67 72 63  Resp: _1 Temp: 98.3 F (36.8 C) 98.2 F (36.8 C) 98.1 F (36.7 C) 98 F (36.7 C)  TempSrc: Oral Oral Oral Oral  SpO2: 97%  93% 96%  Weight:        Intake/Output Summary (Last 24 hours) at 01/16/2020 1033 Last data filed at 01/16/2020 0300 Gross per 24 hour  Intake 944.7 ml  Output --  Net 944.7 ml   Filed Weights   01/14/20 1603  Weight: 66.5 kg    Examination:  General exam: Appears calm and comfortable  Respiratory system: Clear to auscultation. Respiratory effort normal. Cardiovascular system: S1 & S2 heard, RRR. No JVD, murmurs, rubs, gallops or clicks. No pedal edema. Gastrointestinal system: Abdomen is nondistended, soft and nontender. No organomegaly or masses felt. Normal bowel sounds heard. Central nervous system: Alert and oriented. No focal neurological deficits. Extremities: Symmetric 5 x 5 power.  Limited range of motion in the left knee and bilateral hands.  Slightly edematous left hand but improved compared to yesterday.  Tender left hand and right hand but significantly improvement compared to yesterday. Skin: No rashes, lesions or ulcers.  Psychiatry: Judgement and insight appear  normal. Mood & affect appropriate.    Data Reviewed: I have personally reviewed following labs and imaging studies  CBC: Recent Labs  Lab 01/14/20 0846 01/15/20 0524 01/16/20 0508  WBC 9.2 8.2 8.5  NEUTROABS 6.3  --  6.8  HGB 12.6 11.2* 12.2  HCT 38.8 34.9* 37.7  MCV 89.4 90.6 88.9  PLT 217 222 876   Basic Metabolic Panel: Recent Labs  Lab 01/14/20 0846 01/15/20 0524 01/16/20 0508  NA 146* 140 141  K 4.0 3.8 4.4  CL 116* 107 109  CO2 _2 GLUCOSE 100* 87 134*  BUN _3 CREATININE 0.46 0.50 0.41*  CALCIUM 8.6* 8.0* 9.0  GFR: CrCl cannot be calculated (Unknown ideal weight.). Liver Function Tests: Recent Labs  Lab 01/14/20 0846 01/15/20 0524 01/16/20 0508  AST 56* 26 48*  ALT 79* 50* 71*  ALKPHOS 99 91 109  BILITOT 0.5 0.5 0.5  PROT 6.6 5.7* 6.8  ALBUMIN 3.4* 2.9* 3.2*   No results for input(s): LIPASE, AMYLASE in the last 168 hours. No results for input(s): AMMONIA in the last 168 hours. Coagulation Profile: Recent Labs  Lab 01/14/20 0846  INR 1.1   Cardiac Enzymes: No results for input(s): CKTOTAL, CKMB, CKMBINDEX, TROPONINI in the last 168 hours. BNP (last 3 results) No results for input(s): PROBNP in the last 8760 hours. HbA1C: No results for input(s): HGBA1C in the last 72 hours. CBG: No results for input(s): GLUCAP in the last 168 hours. Lipid Profile: No results for input(s): CHOL, HDL, LDLCALC, TRIG, CHOLHDL, LDLDIRECT in the last 72 hours. Thyroid Function Tests: No results for input(s): TSH, T4TOTAL, FREET4, T3FREE, THYROIDAB in the last 72 hours. Anemia Panel: No results for input(s): VITAMINB12, FOLATE, FERRITIN, TIBC, IRON, RETICCTPCT in the last 72 hours. Sepsis Labs: Recent Labs  Lab 01/14/20 0846 01/15/20 1010  PROCALCITON  --  <0.10  LATICACIDVEN 0.7  --     Recent Results (from the past 240 hour(s))  SARS Coronavirus 2 by RT PCR (hospital order, performed in [facility] hospital lab) Nasopharyngeal Nasopharyngeal  Swab     Status: None   Collection Time: 01/14/20  8:33 AM   Specimen: Nasopharyngeal Swab  Result Value Ref Range Status   SARS Coronavirus 2 NEGATIVE NEGATIVE Final    Comment: (NOTE) SARS-CoV-2 target nucleic acids are NOT DETECTED. The SARS-CoV-2 RNA is generally detectable in upper and lower respiratory specimens during the acute phase of infection. The lowest concentration of SARS-CoV-2 viral copies this assay can detect is 250 copies / mL. A negative result does not preclude SARS-CoV-2 infection and should not be used as the sole basis for treatment or other patient management decisions.  A negative result may occur with improper specimen collection / handling, submission of specimen other than nasopharyngeal swab, presence of viral mutation(s) within the areas targeted by this assay, and inadequate number of viral copies (<250 copies / mL). A negative result must be combined with clinical observations, patient history, and epidemiological information. Fact Sheet for Patients:   StrictlyIdeas.no Fact Sheet for Healthcare Providers: BankingDealers.co.za This test is not yet approved or cleared  by the [place] FDA and has been authorized for detection and/or diagnosis of SARS-CoV-2 by FDA under an Emergency Use Authorization (EUA).  This EUA will remain in effect (meaning this test can be used) for the duration of the COVID-19 declaration under Section 564(b)(1) of the Act, 21 U.S.C. section 360bbb-3(b)(1), unless the authorization is terminated or revoked sooner. Performed at [facility] [place], [place] [address] [place], [place] [address]   Culture, blood (routine x 2)     Status: None (Preliminary result)   Collection Time: 01/14/20  8:46 AM   Specimen: Left Antecubital; Blood  Result Value Ref Range Status   Specimen Description   Final    LEFT ANTECUBITAL Performed at [place]  [address], [place], [place] [address]    Special Requests   Final    BOTTLES DRAWN AEROBIC AND ANAEROBIC Blood Culture adequate volume Performed at [place] [address], [place], [place] [address]    Culture   Final    NO GROWTH <  24 HOURS Performed at [facility], [place] [address], [place], [place] [address]    Report Status PENDING  Incomplete  Culture, blood (routine x 2)     Status: None (Preliminary result)   Collection Time: 01/14/20  9:01 AM   Specimen: Right Antecubital; Blood  Result Value Ref Range Status   Specimen Description   Final    RIGHT ANTECUBITAL Performed at [place] [address] [place], [place] [address]    Special Requests   Final    BOTTLES DRAWN AEROBIC AND ANAEROBIC Blood Culture adequate volume Performed at [place] [address], [place], [place] [address]    Culture   Final    NO GROWTH < 24 HOURS Performed at [place] [address] [place], [place] [address]    Report Status PENDING  Incomplete  Wet prep, genital     Status: Abnormal   Collection Time: 01/14/20 11:45 AM   Specimen: Cervix  Result Value Ref Range Status   Yeast Wet Prep HPF POC NONE SEEN NONE SEEN Final   Trich, Wet Prep NONE SEEN NONE SEEN Final   Clue Cells Wet Prep HPF POC PRESENT (A) NONE SEEN Final   WBC, Wet Prep HPF POC FEW (A) NONE SEEN Final   Sperm NONE SEEN  Final    Comment: Performed at [facility], [place] [address] [place], [place] [address]  Urine culture     Status: None   Collection Time: 01/14/20 11:50 AM   Specimen: Urine, Clean Catch  Result Value Ref Range Status   Specimen Description   Final    URINE, CLEAN CATCH Performed at [facility], [place] [address], [place], [place] [address]    Special Requests   Final    NONE Performed at [facility], [place] [address], [place], [place] [address]    Culture    Final    NO GROWTH Performed at [facility], [place] [address], [place], [place] [address]    Report Status 01/15/2020 FINAL  Final      Radiology Studies: No results found.  Scheduled Meds: . enoxaparin (LOVENOX) injection  40 mg Subcutaneous Q24H  . multivitamin with minerals  1 tablet Oral Daily  . predniSONE  40 mg Oral Q breakfast   Continuous Infusions: . sodium chloride 50 mL/hr at 01/16/20 0300     LOS: 2 days   Time spent: 40 minutes   Darliss Cheney, MD [facility]  01/16/2020, 10:33 AM   To contact the attending provider between 7A-7P or the covering provider during after hours 7P-7A, please log into the web site [email or web address].

## 2020-01-17 DIAGNOSIS — A5486 Gonococcal sepsis: Secondary | ICD-10-CM

## 2020-01-17 LAB — COMPREHENSIVE METABOLIC PANEL
ALT: 59 U/L — ABNORMAL HIGH (ref 0–44)
AST: 28 U/L (ref 15–41)
Albumin: 3.1 g/dL — ABNORMAL LOW (ref 3.5–5.0)
Alkaline Phosphatase: 103 U/L (ref 38–126)
Anion gap: 8 (ref 5–15)
BUN: 12 mg/dL (ref 6–20)
CO2: 23 mmol/L (ref 22–32)
Calcium: 8.7 mg/dL — ABNORMAL LOW (ref 8.9–10.3)
Chloride: 113 mmol/L — ABNORMAL HIGH (ref 98–111)
Creatinine, Ser: 0.54 mg/dL (ref 0.44–1.00)
GFR calc Af Amer: >60 mL/min (ref >60)
GFR calc non Af Amer: >60 mL/min (ref >60)
Glucose, Bld: 98 mg/dL (ref 70–99)
Potassium: 3.8 mmol/L (ref 3.5–5.1)
Sodium: 144 mmol/L (ref 135–145)
Total Bilirubin: 0.6 mg/dL (ref 0.3–1.2)
Total Protein: 6.3 g/dL — ABNORMAL LOW (ref 6.5–8.1)

## 2020-01-17 LAB — CBC WITH DIFFERENTIAL/PLATELET
Abs Immature Granulocytes: 0.05 10*3/uL (ref 0.00–0.07)
Basophils Absolute: 0 10*3/uL (ref 0.0–0.1)
Basophils Relative: 0 %
Eosinophils Absolute: 0 10*3/uL (ref 0.0–0.5)
Eosinophils Relative: 0 %
HCT: 34.8 % — ABNORMAL LOW (ref 36.0–46.0)
Hemoglobin: 11.5 g/dL — ABNORMAL LOW (ref 12.0–15.0)
Immature Granulocytes: 1 %
Lymphocytes Relative: 29 %
Lymphs Abs: 3.1 10*3/uL (ref 0.7–4.0)
MCH: 29.6 pg (ref 26.0–34.0)
MCHC: 33 g/dL (ref 30.0–36.0)
MCV: 89.5 fL (ref 80.0–100.0)
Monocytes Absolute: 0.5 10*3/uL (ref 0.1–1.0)
Monocytes Relative: 5 %
Neutro Abs: 7 10*3/uL (ref 1.7–7.7)
Neutrophils Relative %: 65 %
Platelets: 260 10*3/uL (ref 150–400)
RBC: 3.89 MIL/uL (ref 3.87–5.11)
RDW: 13.4 % (ref 11.5–15.5)
WBC: 10.7 10*3/uL — ABNORMAL HIGH (ref 4.0–10.5)
nRBC: 0 % (ref 0.0–0.2)

## 2020-01-17 LAB — QUANTIFERON-TB GOLD PLUS (RQFGPL)
QuantiFERON Mitogen Value: >10 IU/mL
QuantiFERON Nil Value: 0.32 IU/mL
QuantiFERON TB1 Ag Value: 0.28 IU/mL
QuantiFERON TB2 Ag Value: 0.31 IU/mL

## 2020-01-17 LAB — QUANTIFERON-TB GOLD PLUS: QuantiFERON-TB Gold Plus: NEGATIVE

## 2020-01-17 LAB — CYCLIC CITRUL PEPTIDE ANTIBODY, IGG/IGA: CCP Antibodies IgG/IgA: 2 units (ref 0–19)

## 2020-01-17 MED ORDER — CEFTRIAXONE SODIUM 1 G IJ SOLR: 1.0000 g | INTRAMUSCULAR | 0 refills | Status: AC

## 2020-01-17 MED ORDER — CEFTRIAXONE SODIUM 1 G IJ SOLR
1.0000 g | Freq: Once | INTRAMUSCULAR | Status: AC
  Administered 2020-01-17: 1 g via INTRAMUSCULAR
  Filled 2020-01-17: qty 10

## 2020-01-17 MED FILL — CEFTRIAXONE 1 GM VIAL: 1 | 3 days supply | Qty: 3 | Fill #0

## 2020-01-17 NOTE — TOC Transition Note (Signed)
Transition of Care [facility]) - CM/SW Discharge Note   Patient Details  Name: Candice Davis MRN: [id number] Date of Birth: 07/10/1991  [facility]) CM/SW Contact:  Jamesyn Lindell, Meriam Sprague, RN Phone Number: 01/17/2020, 2:00 PM   Clinical Narrative:    Pt to dc on 3 days of IM rocephin. This CM contacted [facility] outpatient pharmacy to make sure they have in stock. Arlys John Pharmacist is going to work with our inpatient pharmacy to get the rocephin for pt to purchase. Pt to be supplied with sterile water by pharmacy as well, for diluent. RN staff to teach pt how to give injection and to provide pt with 3 syringes for home.

## 2020-01-17 NOTE — Discharge Instructions (Signed)
Gonorrhea Gonorrhea is a sexually transmitted disease (STD) that can affect both men and women. If left untreated, this infection can:  Damage the female or female organs.  Cause women and men to be unable to have children (be sterile).  Harm a fetus if an infected woman is pregnant. It is important to get treatment for gonorrhea as soon as possible. It is also necessary for all of your sexual partners to be tested for the infection. What are the causes? This condition is caused by bacteria called Neisseria gonorrhoeae. The infection is spread from person to person through sexual contact, including oral, anal, and vaginal sex. A newborn can contract the infection from his or her mother during birth. What increases the risk? The following factors may make you more likely to develop this condition:  Being a woman who is younger than 29 years of age and who is sexually active.  Being a woman 25 years of age or older who has: ? A new sex partner. ? More than one sex partner. ? A sex partner who has an STD.  Being a man who has: ? A new sex partner. ? More than one sex partner. ? A sex partner who has an STD.  Using condoms inconsistently.  Currently having, or having previously had, an STD.  Exchanging sex for money or drugs. What are the signs or symptoms? Some people do not have any symptoms. If you do have symptoms, they may be different for females and males. For females  Pain in the lower abdomen.  Abnormal vaginal discharge. The discharge may be cloudy, thick, or yellow-green in color.  Bleeding between periods.  Painful sex.  Burning or itching in and around the vagina.  Pain or burning when urinating.  Irritation, pain, bleeding, or discharge from the rectum. This may occur if the infection was spread by anal sex.  Sore throat or swollen lymph nodes in the neck. This may occur if the infection was spread by oral sex. For males  Abnormal discharge from the penis.  This discharge may be cloudy, thick, or yellow-green in color.  Pain or burning during urination.  Pain or swelling in the testicles.  Irritation, pain, bleeding, or discharge from the rectum. This may occur if the infection was spread by anal sex.  Sore throat, fever, or swollen lymph nodes in the neck. This may occur if the infection was spread by oral sex. How is this diagnosed? This condition is diagnosed based on:  A physical exam.  A sample of discharge that is examined under a microscope to look for the bacteria. The discharge may be taken from the urethra, cervix, throat, or rectum.  Urine tests. Not all of test results will be available during your visit. How is this treated? This condition is treated with antibiotic medicines. It is important for treatment to begin as soon as possible. Early treatment may prevent some problems from developing. Do not have sex during treatment. Avoid all types of sexual activity for 7 days after treatment is complete and until any sex partners have been treated. Follow these instructions at home:  Take over-the-counter and prescription medicines only as told by your health care provider.  Take your antibiotic medicine as told by your health care provider. Do not stop taking the antibiotic even if you start to feel better.  Do not have sex until at least 7 days after you and your partner(s) have finished treatment and your health care provider says it is okay.    It is your responsibility to get your test results. Ask your health care provider, or the department performing the test, when your results will be ready.  If you test positive for gonorrhea, inform your recent sexual partners. This includes any oral, anal, or vaginal sex partners. They need to be checked for gonorrhea even if they do not have symptoms. They may need treatment, even if they test negative for gonorrhea.  Keep all follow-up visits as told by your health care provider.  This is important. How is this prevented?   Use latex condoms correctly every time you have sexual intercourse.  Ask if your sexual partner has been tested for STDs and had negative results.  Avoid having multiple sexual partners. Contact a health care provider if:  You develop a bad reaction to the medicine you were prescribed. This may include: ? A rash. ? Nausea. ? Vomiting. ? Diarrhea.  Your symptoms do not get better after a few days of taking antibiotics.  Your symptoms get worse.  You develop new symptoms.  Your pain gets worse.  You have a fever.  You develop pain, itching, or discharge around the eyes. Get help right away if:  You feel dizzy or faint.  You have trouble breathing or have shortness of breath.  You develop an irregular heartbeat.  You have severe abdominal pain with or without shoulder pain.  You develop any bumps or sores (lesions) on your skin.  You develop warmth, redness, pain, or swelling around your joints, such as the knee. Summary  Gonorrhea is an STD that can affect both men and women.  This condition is caused by bacteria called Neisseria gonorrhoeae. The infection is spread from person to person, usually through sexual contact, including oral, anal, and vaginal sex.  Symptoms vary between males and females. Generally, they include abnormal discharge and burning during urination. Women may also experience painful sex, itching around the vagina, and bleeding between menstrual periods. Men may also experience swelling of the testicles.  This condition is treated with antibiotic medicines. Do not have sex until at least 7 days after completing antibiotic treatment.  If left untreated, gonorrhea can have serious side effects and complications. This information is not intended to replace advice given to you by your health care provider. Make sure you discuss any questions you have with your health care provider. Document Revised:  12/25/2018 Document Reviewed: 07/01/2016 Elsevier Patient Education  2020 Elsevier Inc.  

## 2020-01-17 NOTE — Progress Notes (Signed)
Patient was taught how to give IM rocephin. Patient videoed to show her mother who will be giving her the injections. Patient verbalized an understanding.

## 2020-01-17 NOTE — Discharge Summary (Signed)
Physician Discharge Summary  Candice Davis IZT:[id number] DOB: 07-Jul-1991 DOA: 01/14/2020  PCP: Patient, No Pcp Per  Admit date: 01/14/2020 Discharge date: 01/17/2020  Admitted From: Home Disposition: Home  Recommendations for Outpatient Follow-up:  1. Follow up with PCP in 1-2 weeks 2. Please obtain BMP/CBC in one week 3. Please follow up with your PCP on the following pending results: Unresulted Labs (From admission, onward)    Start     Ordered   01/16/20 0500  CBC with Differential/Platelet  Daily,   R    Question:  Specimen collection method  Answer:  Lab=Lab collect   01/15/20 [place]: None Equipment/Devices: None  Discharge Condition: Stable CODE STATUS: Full code Diet recommendation: Regular  Subjective: Seen and examined.  Feels much better.  Minimal pain and tenderness in the left hand.  No pain in the right hand or left knee.  HPI: We[place] a29 y.o.female,without significant past medical history, she has 33-monthold kids, who she is breast-feeding presented with complaints of hand swelling, multiple joint swelling and pain started last Saturday (4 days ago),reports symptoms initially mild, but has been progressed, and it has been very peripheral over last 24 hours, she reported more severe pain in the left hand, but currently does exist as well in the left hand, bilateral elbow, and right shoulder, as well she does report bilateral knees, and it is difficult for her for weightbearing at this point secondary to bilateral knee pain, and yesterday she started to experience some pain in the lower back area as well, denies any focal deficits, tingling, numbness,, no headache, no stiff neck, no fever, but she does endorse some chills, photophobia, as well she does report some dysuria, she denies any history of smoking, substance abuse, or alcohol abuse,patient is married,monogamouswith her husband, she does not suspect any sexually  transmitted disease from her husband. 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  Will start on prednisone 40 mg p.o. daily in the meantime.   Brief/Interim Summary: Patient was admitted with polyarthritis/polyarthralgia.  Extensive work-up was done.  She was found to have elevated inflammatory markers which were ESR and CRP.  For UTI, she was initially started on Rocephin.  Her vaginal discharge swab was collected for GC chlamydia and subsequently she was positive for gonorrhea.  She was also started on prednisone 40 mg p.o. daily for possible underlying rheumatoid arthritis or rheumatological/autoimmune cause of her polyarthritis.  Patient improved significantly within 24 hours of Rocephin and prednisone.  Her rheumatoid factor and anti-CCP and rest of the autoimmune work-up came back negative.  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  She will receive her fourth dose of IV Rocephin 1 g.  Now she is feeling better.  She was seen by PT OT.  OT did not recommend further treatment however PT recommended outpatient PT but patient has improved since then and does not feel like she needs any further PT.  She would like to go home.  Labs are normal.  She is being discharged home  in stable condition.  I have discussed in length with case manager who is working on arranging outpatient IV or IM Rocephin 1 g daily for at least next 3 days to complete 7-day course.  She will be discharged once that arrangement is done.   She understands that she needs to make arrangements to see her PCP.   Discharge Diagnoses:  Active Problems:   Polyarthralgia   Polyarthritis   Acute lower UTI    Discharge Instructions  Discharge Instructions    Discharge patient   Complete by: As directed    Discharge ONLY WHEN her outpatient Rocephin injections are arranged by case manager and please make sure she gets her dose of IV Rocephin today before discharge.  Do not discharge without these arrangements are in place.   Discharge disposition: 01-Home or Self Care   Discharge patient date: 01/17/2020     Allergies as of 01/17/2020   No Known Allergies     Medication List    TAKE these medications   acetaminophen 500 MG tablet Commonly known as: TYLENOL Take 500 mg by mouth every 6 (six) hours as needed for moderate pain.   cefTRIAXone 1 g injection Commonly known as: ROCEPHIN Inject 1 g into the muscle daily for 3 doses. Start taking on: January 18, 2020   ibuprofen 200 MG tablet Commonly known as: ADVIL Take 200 mg by mouth every 6 (six) hours as needed for moderate pain.   ibuprofen 600 MG tablet Commonly known as: ADVIL Take 1 tablet (600 mg total) by mouth every 6 (six) hours.   senna-docusate 8.6-50 MG tablet Commonly known as: Senokot-S Take 2 tablets by mouth daily.      Follow-up [facility] Follow up.   Why: Call to establish primary care provider. They have a pharmacy and financial counselors Contact information: [address] [place] [address] [phone]         No Known Allergies  Consultations: None, discussed over the phone with ID who had recommended 7 days of Rocephin to treat disseminated gonococcal infection   Procedures/Studies: DG Chest Port 1 View  Result Date: 01/14/2020 CLINICAL DATA:  Left-sided chest pain. EXAM: PORTABLE CHEST 1 VIEW COMPARISON:  None. FINDINGS: The heart size and mediastinal contours  are within normal limits. Both lungs are clear. The visualized skeletal structures are unremarkable. IMPRESSION: No active disease. Electronically Signed   By: Monte Fantasia M.D.   On: 01/14/2020 08:49   DG Hand Complete Left  Result Date: 01/14/2020 CLINICAL DATA:  LEFT hand swelling and redness. No reported recent trauma. EXAM: LEFT HAND - COMPLETE 3+ VIEW COMPARISON:  None FINDINGS: No sign of fracture. Subtle widening of the space between the third and fourth metacarpal bases. No sign of frank subluxation or dislocation. Soft tissue swelling over the ulnar aspect of the hand along the fifth metacarpal. IMPRESSION: 1. Soft tissue swelling without sign of fracture. 2. Subtle widening of the space between the third and fourth metacarpal bases. Correlate with any point tenderness, while this may be rhythm the range of normal subtle ligamentous injury is considered. Electronically Signed   By: Zetta Bills M.D.   On: 01/14/2020 09:05      Discharge Exam: Vitals:   01/16/20 2140 01/17/20 0520  BP: 121/79 120/89  Pulse: 64 63  Resp: 14 14  Temp: 98.1 F (36.7 C) 97.8 F (36.6 C)  SpO2: 95% 96%   Vitals:   01/16/20  0530 01/16/20 1338 01/16/20 2140 01/17/20 0520  BP: 111/74 116/85 121/79 120/89  Pulse: 63 66 64 63  Resp: _0 Temp: 98 F (36.7 C) 98.5 F (36.9 C) 98.1 F (36.7 C) 97.8 F (36.6 C)  TempSrc: Oral Oral Oral Oral  SpO2: 96% 95% 95% 96%  Weight:        General: Pt is alert, awake, not in acute distress Cardiovascular: RRR, S1/S2 +, no rubs, no gallops Respiratory: CTA bilaterally, no wheezing, no rhonchi Abdominal: Soft, NT, ND, bowel sounds + Extremities: no edema, no cyanosis, mild left lateral hand tenderness    The results of significant diagnostics from this hospitalization (including imaging, microbiology, ancillary and laboratory) are listed below for reference.     Microbiology: Recent Results (from the past 240 hour(s))  SARS Coronavirus 2 by  RT PCR (hospital order, performed in [facility] hospital lab) Nasopharyngeal Nasopharyngeal Swab     Status: None   Collection Time: 01/14/20  8:33 AM   Specimen: Nasopharyngeal Swab  Result Value Ref Range Status   SARS Coronavirus 2 NEGATIVE NEGATIVE Final    Comment: (NOTE) SARS-CoV-2 target nucleic acids are NOT DETECTED. The SARS-CoV-2 RNA is generally detectable in upper and lower respiratory specimens during the acute phase of infection. The lowest concentration of SARS-CoV-2 viral copies this assay can detect is 250 copies / mL. A negative result does not preclude SARS-CoV-2 infection and should not be used as the sole basis for treatment or other patient management decisions.  A negative result may occur with improper specimen collection / handling, submission of specimen other than nasopharyngeal swab, presence of viral mutation(s) within the areas targeted by this assay, and inadequate number of viral copies (<250 copies / mL). A negative result must be combined with clinical observations, patient history, and epidemiological information. Fact Sheet for Patients:   StrictlyIdeas.no Fact Sheet for Healthcare Providers: BankingDealers.co.za This test is not yet approved or cleared  by the [place] FDA and has been authorized for detection and/or diagnosis of SARS-CoV-2 by FDA under an Emergency Use Authorization (EUA).  This EUA will remain in effect (meaning this test can be used) for the duration of the COVID-19 declaration under Section 564(b)(1) of the Act, 21 U.S.C. section 360bbb-3(b)(1), unless the authorization is terminated or revoked sooner. Performed at [facility], [place] [address], [place], [place] [address]   Culture, blood (routine x 2)     Status: None (Preliminary result)   Collection Time: 01/14/20  8:46 AM   Specimen: Left Antecubital; Blood  Result Value Ref Range Status   Specimen  Description   Final    LEFT ANTECUBITAL Performed at [facility] [address] [place], [place] [address]    Special Requests   Final    BOTTLES DRAWN AEROBIC AND ANAEROBIC Blood Culture adequate volume Performed at [facility] [address], [place], [place] [address]    Culture   Final    NO GROWTH 2 DAYS Performed at [facility] [address], [place], [place] [address]    Report Status PENDING  Incomplete  Culture, blood (routine x 2)     Status: None (Preliminary result)   Collection Time: 01/14/20  9:01 AM   Specimen: Right Antecubital; Blood  Result Value Ref Range Status   Specimen Description   Final    RIGHT ANTECUBITAL Performed at [facility] [address] [place],  [address]  Special Requests   Final    BOTTLES DRAWN AEROBIC AND ANAEROBIC Blood Culture adequate volume Performed at [facility] [address] [place], [place] [address]    Culture   Final    NO GROWTH 2 DAYS Performed at [facility] [address], [place], [place] [address]    Report Status PENDING  Incomplete  Wet prep, genital     Status: Abnormal   Collection Time: 01/14/20 11:45 AM   Specimen: Cervix  Result Value Ref Range Status   Yeast Wet Prep HPF POC NONE SEEN NONE SEEN Final   Trich, Wet Prep NONE SEEN NONE SEEN Final   Clue Cells Wet Prep HPF POC PRESENT (A) NONE SEEN Final   WBC, Wet Prep HPF POC FEW (A) NONE SEEN Final   Sperm NONE SEEN  Final    Comment: Performed at [facility], [place] [address], [place], [place] [address]  Urine culture     Status: None   Collection Time: 01/14/20 11:50 AM   Specimen: Urine, Clean Catch  Result Value Ref Range Status   Specimen Description   Final    URINE, CLEAN CATCH Performed at [facility], [place] [address], [place], [place] [address]    Special Requests   Final    NONE Performed at  [facility] [place] [address] [place], [place] [address]    Culture   Final    NO GROWTH Performed at [facility], [place] [address], [place], [place] [address]    Report Status 01/15/2020 FINAL  Final     Labs: BNP (last 3 results) No results for input(s): BNP in the last 8760 hours. Basic Metabolic Panel: Recent Labs  Lab 01/14/20 0846 01/15/20 0524 01/16/20 0508 01/17/20 0502  NA 146* 140 141 144  K 4.0 3.8 4.4 3.8  CL 116* 107 109 113*  CO2 _0 GLUCOSE 100* 87 134* 98  BUN _1 CREATININE 0.46 0.50 0.41* 0.54  CALCIUM 8.6* 8.0* 9.0 8.7*   Liver Function Tests: Recent Labs  Lab 01/14/20 0846 01/15/20 0524 01/16/20 0508 01/17/20 0502  AST 56* 26 48* 28  ALT 79* 50* 71* 59*  ALKPHOS 99 91 109 103  BILITOT 0.5 0.5 0.5 0.6  PROT 6.6 5.7* 6.8 6.3*  ALBUMIN 3.4* 2.9* 3.2* 3.1*   No results for input(s): LIPASE, AMYLASE in the last 168 hours. No results for input(s): AMMONIA in the last 168 hours. CBC: Recent Labs  Lab 01/14/20 0846 01/15/20 0524 01/16/20 0508 01/17/20 0502  WBC 9.2 8.2 8.5 10.7*  NEUTROABS 6.3  --  6.8 7.0  HGB 12.6 11.2* 12.2 11.5*  HCT 38.8 34.9* 37.7 34.8*  MCV 89.4 90.6 88.9 89.5  PLT 217 222 262 260   Cardiac Enzymes: No results for input(s): CKTOTAL, CKMB, CKMBINDEX, TROPONINI in the last 168 hours. BNP: Invalid input(s): POCBNP CBG: No results for input(s): GLUCAP in the last 168 hours. D-Dimer No results for input(s): DDIMER in the last 72 hours. Hgb A1c No results for input(s): HGBA1C in the last 72 hours. Lipid Profile No results for input(s): CHOL, HDL, LDLCALC, TRIG, CHOLHDL, LDLDIRECT in the last 72 hours. Thyroid function studies No results for input(s): TSH, T4TOTAL, T3FREE, THYROIDAB in the last 72 hours.  Invalid input(s): FREET3 Anemia work up No results for input(s): VITAMINB12, FOLATE, FERRITIN, TIBC, IRON, RETICCTPCT in the last 72 hours. Urinalysis  Component Value  Date/Time   COLORURINE YELLOW 01/14/2020 1150   APPEARANCEUR CLEAR 01/14/2020 1150   LABSPEC 1.016 01/14/2020 1150   PHURINE 5.0 01/14/2020 1150   GLUCOSEU NEGATIVE 01/14/2020 1150   HGBUR SMALL (A) 01/14/2020 1150   BILIRUBINUR NEGATIVE 01/14/2020 1150   KETONESUR NEGATIVE 01/14/2020 1150   PROTEINUR NEGATIVE 01/14/2020 1150   NITRITE NEGATIVE 01/14/2020 1150   LEUKOCYTESUR TRACE (A) 01/14/2020 1150   Sepsis Labs Invalid input(s): PROCALCITONIN,  WBC,  LACTICIDVEN Microbiology Recent Results (from the past 240 hour(s))  SARS Coronavirus 2 by RT PCR (hospital order, performed in [facility] hospital lab) Nasopharyngeal Nasopharyngeal Swab     Status: None   Collection Time: 01/14/20  8:33 AM   Specimen: Nasopharyngeal Swab  Result Value Ref Range Status   SARS Coronavirus 2 NEGATIVE NEGATIVE Final    Comment: (NOTE) SARS-CoV-2 target nucleic acids are NOT DETECTED. The SARS-CoV-2 RNA is generally detectable in upper and lower respiratory specimens during the acute phase of infection. The lowest concentration of SARS-CoV-2 viral copies this assay can detect is 250 copies / mL. A negative result does not preclude SARS-CoV-2 infection and should not be used as the sole basis for treatment or other patient management decisions.  A negative result may occur with improper specimen collection / handling, submission of specimen other than nasopharyngeal swab, presence of viral mutation(s) within the areas targeted by this assay, and inadequate number of viral copies (<250 copies / mL). A negative result must be combined with clinical observations, patient history, and epidemiological information. Fact Sheet for Patients:   StrictlyIdeas.no Fact Sheet for Healthcare Providers: BankingDealers.co.za This test is not yet approved or cleared  by the [place] FDA and has been authorized for detection and/or diagnosis of SARS-CoV-2 by FDA  under an Emergency Use Authorization (EUA).  This EUA will remain in effect (meaning this test can be used) for the duration of the COVID-19 declaration under Section 564(b)(1) of the Act, 21 U.S.C. section 360bbb-3(b)(1), unless the authorization is terminated or revoked sooner. Performed at [facility], [place] [address], [place], [place] [address]   Culture, blood (routine x 2)     Status: None (Preliminary result)   Collection Time: 01/14/20  8:46 AM   Specimen: Left Antecubital; Blood  Result Value Ref Range Status   Specimen Description   Final    LEFT ANTECUBITAL Performed at [facility] [address], [place], [place] [address]    Special Requests   Final    BOTTLES DRAWN AEROBIC AND ANAEROBIC Blood Culture adequate volume Performed at [facility] [address], [place], [place] [address]    Culture   Final    NO GROWTH 2 DAYS Performed at [facility] [address] [place], [place] [address]    Report Status PENDING  Incomplete  Culture, blood (routine x 2)     Status: None (Preliminary result)   Collection Time: 01/14/20  9:01 AM   Specimen: Right Antecubital; Blood  Result Value Ref Range Status   Specimen Description   Final    RIGHT ANTECUBITAL Performed at [facility] [address] [place], [place] [address]    Special Requests   Final    BOTTLES DRAWN AEROBIC AND ANAEROBIC Blood Culture adequate volume Performed at [facility] [place] [address], [place], [place] [address]    Culture   Final    NO GROWTH 2 DAYS Performed at [facility], [place] [address] [place], [place] [address]    Report Status PENDING  Incomplete  Wet prep, genital     Status: Abnormal   Collection Time: 01/14/20 11:45 AM   Specimen: Cervix  Result Value Ref Range Status   Yeast Wet Prep HPF POC NONE SEEN NONE SEEN Final   Trich, Wet Prep NONE SEEN NONE SEEN Final   Clue  Cells Wet Prep HPF POC PRESENT (A) NONE SEEN Final   WBC, Wet Prep HPF POC FEW (A) NONE SEEN Final   Sperm NONE SEEN  Final    Comment: Performed at [facility], [place] [address], [place], [place] [address]  Urine culture     Status: None   Collection Time: 01/14/20 11:50 AM   Specimen: Urine, Clean Catch  Result Value Ref Range Status   Specimen Description   Final    URINE, CLEAN CATCH Performed at [facility], [place] [address] [place], [place] [address]    Special Requests   Final    NONE Performed at [facility], [place] [address], [place], [place] [address]    Culture   Final    NO GROWTH Performed at [facility], [place] [address], [place], [place] [address]    Report Status 01/15/2020 FINAL  Final     Time coordinating discharge: Over 30 minutes  SIGNED:   Darliss Cheney, MD  [facility] 01/17/2020, 9:29 AM  If 7PM-7AM, please contact night-coverage [email or web address]

## 2020-01-19 LAB — CULTURE, BLOOD (ROUTINE X 2)
Culture: NO GROWTH
Culture: NO GROWTH
Special Requests: ADEQUATE
Special Requests: ADEQUATE

## 2021-09-12 IMAGING — DX DG HAND COMPLETE 3+V*L*
3 series · 3 of 3 positions shown · non-contrast
Comparison: None

CLINICAL DATA: LEFT hand swelling and redness. No reported recent
trauma.

EXAM:
LEFT HAND - COMPLETE 3+ VIEW

[hand ap]
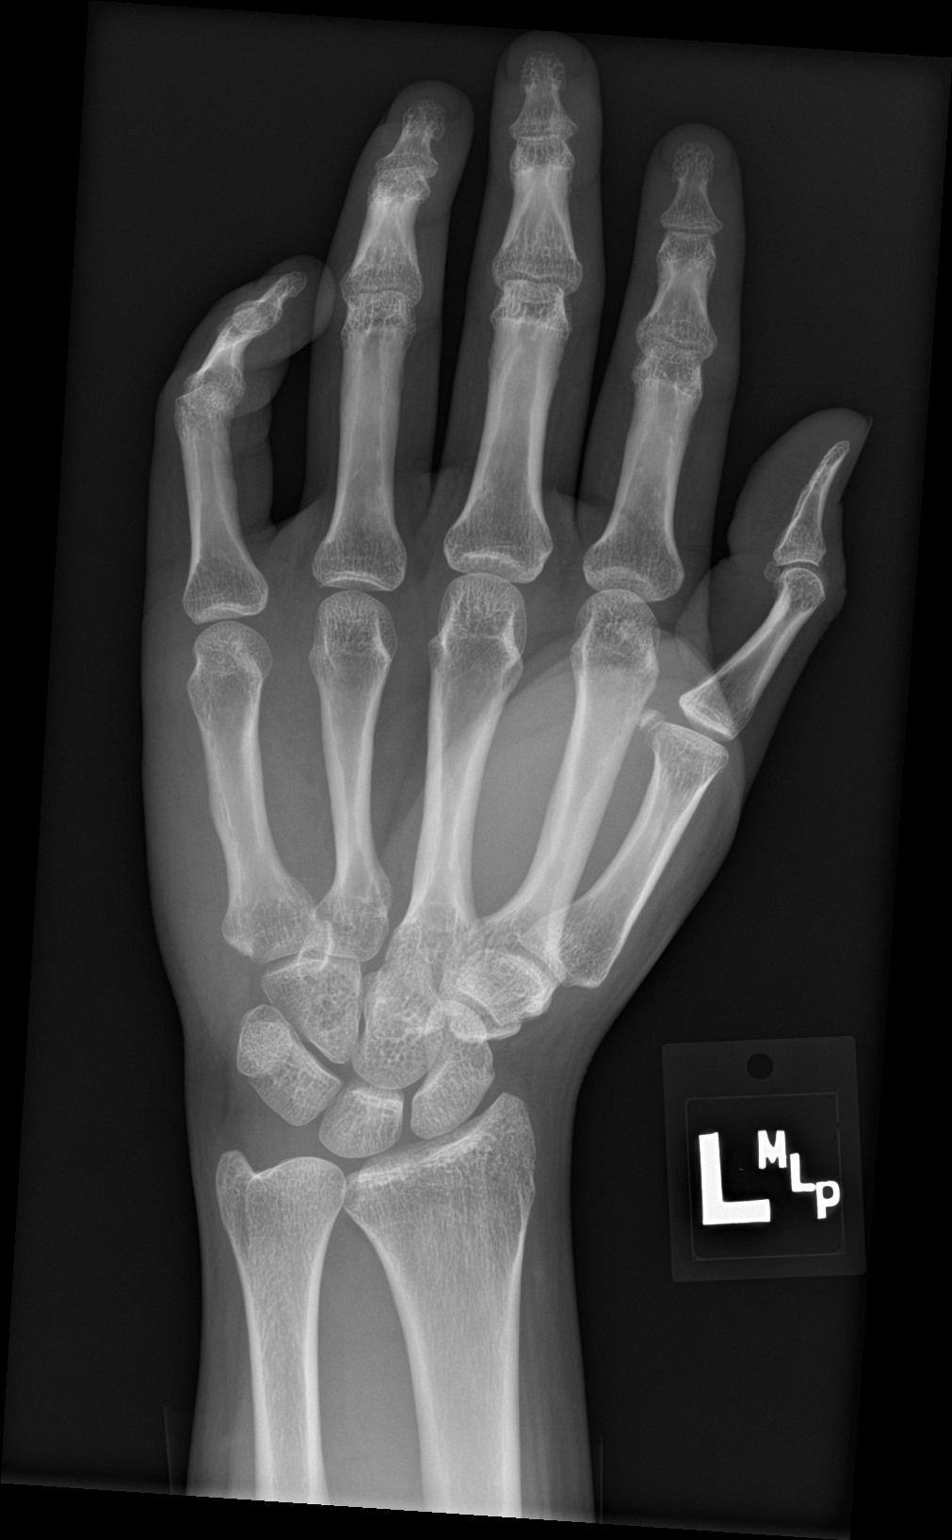

[hand obl]
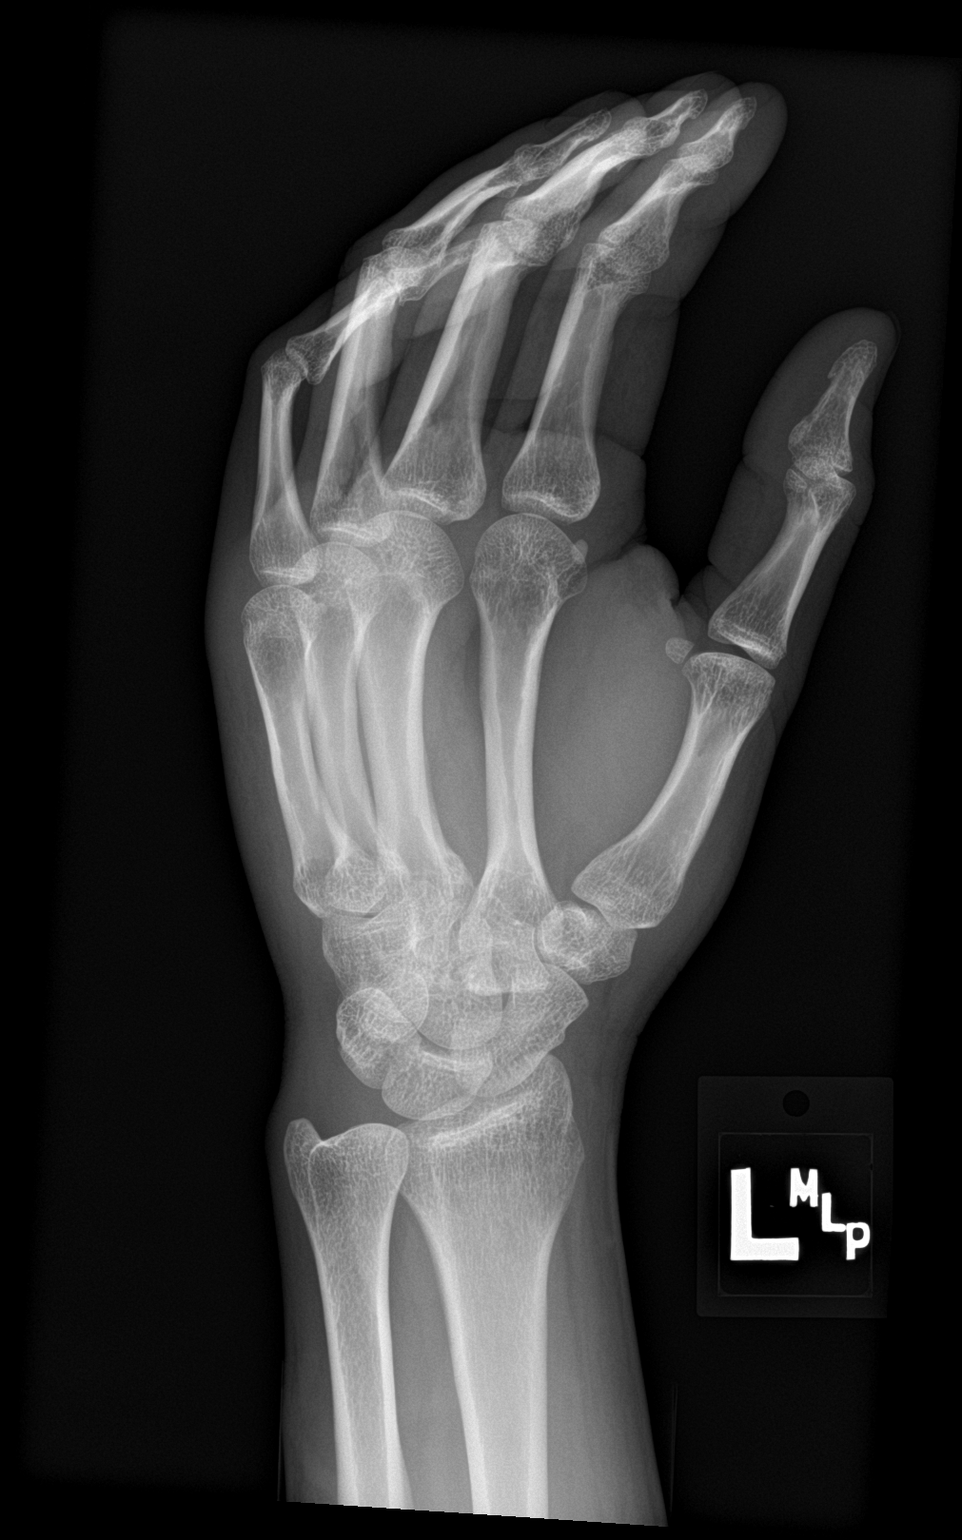

[hand lat]
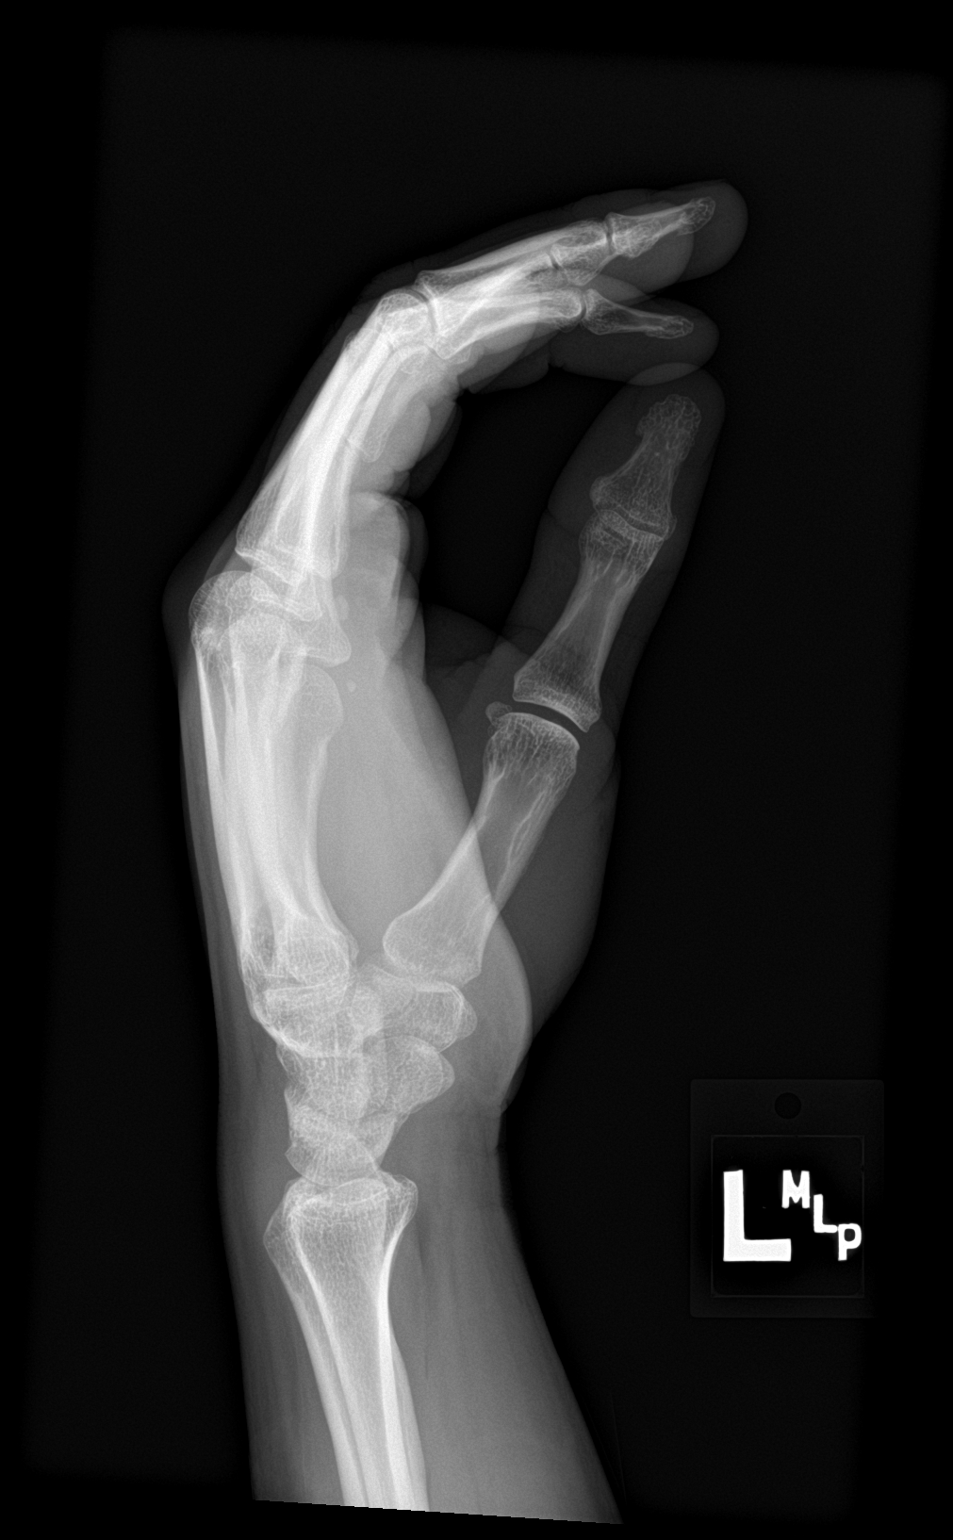

[3 of 3 positions shown; findings below may reference images not displayed]

FINDINGS: No sign of fracture. Subtle widening of the space between the third
and fourth metacarpal bases. No sign of frank subluxation or
dislocation. Soft tissue swelling over the ulnar aspect of the hand
along the fifth metacarpal.
IMPRESSION: 1. Soft tissue swelling without sign of fracture.
2. Subtle widening of the space between the third and fourth
metacarpal bases. Correlate with any point tenderness, while this
may be rhythm the range of normal subtle ligamentous injury is
considered.
